# Patient Record
Sex: Female | Born: 1990 | Race: Black or African American | Hispanic: No | Marital: Single | Smoking: Current every day smoker
Health system: Southern US, Community
[De-identification: ages and names within clinical notes are randomized; demographics above are authoritative.]

---
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
## 2010-01-17 ENCOUNTER — Emergency Department (HOSPITAL_COMMUNITY): Admission: EM | Admit: 2010-01-17 | Discharge: 2010-01-18 | Payer: Self-pay | Admitting: Emergency Medicine

## 2010-10-03 IMAGING — CR DG CHEST 2V
2 series · 2 of 2 positions shown · non-contrast
Comparison: 09/05/2009

CLINICAL DATA: Cough.  Mid chest pain.

CHEST - 2 VIEW

[w chest pa]
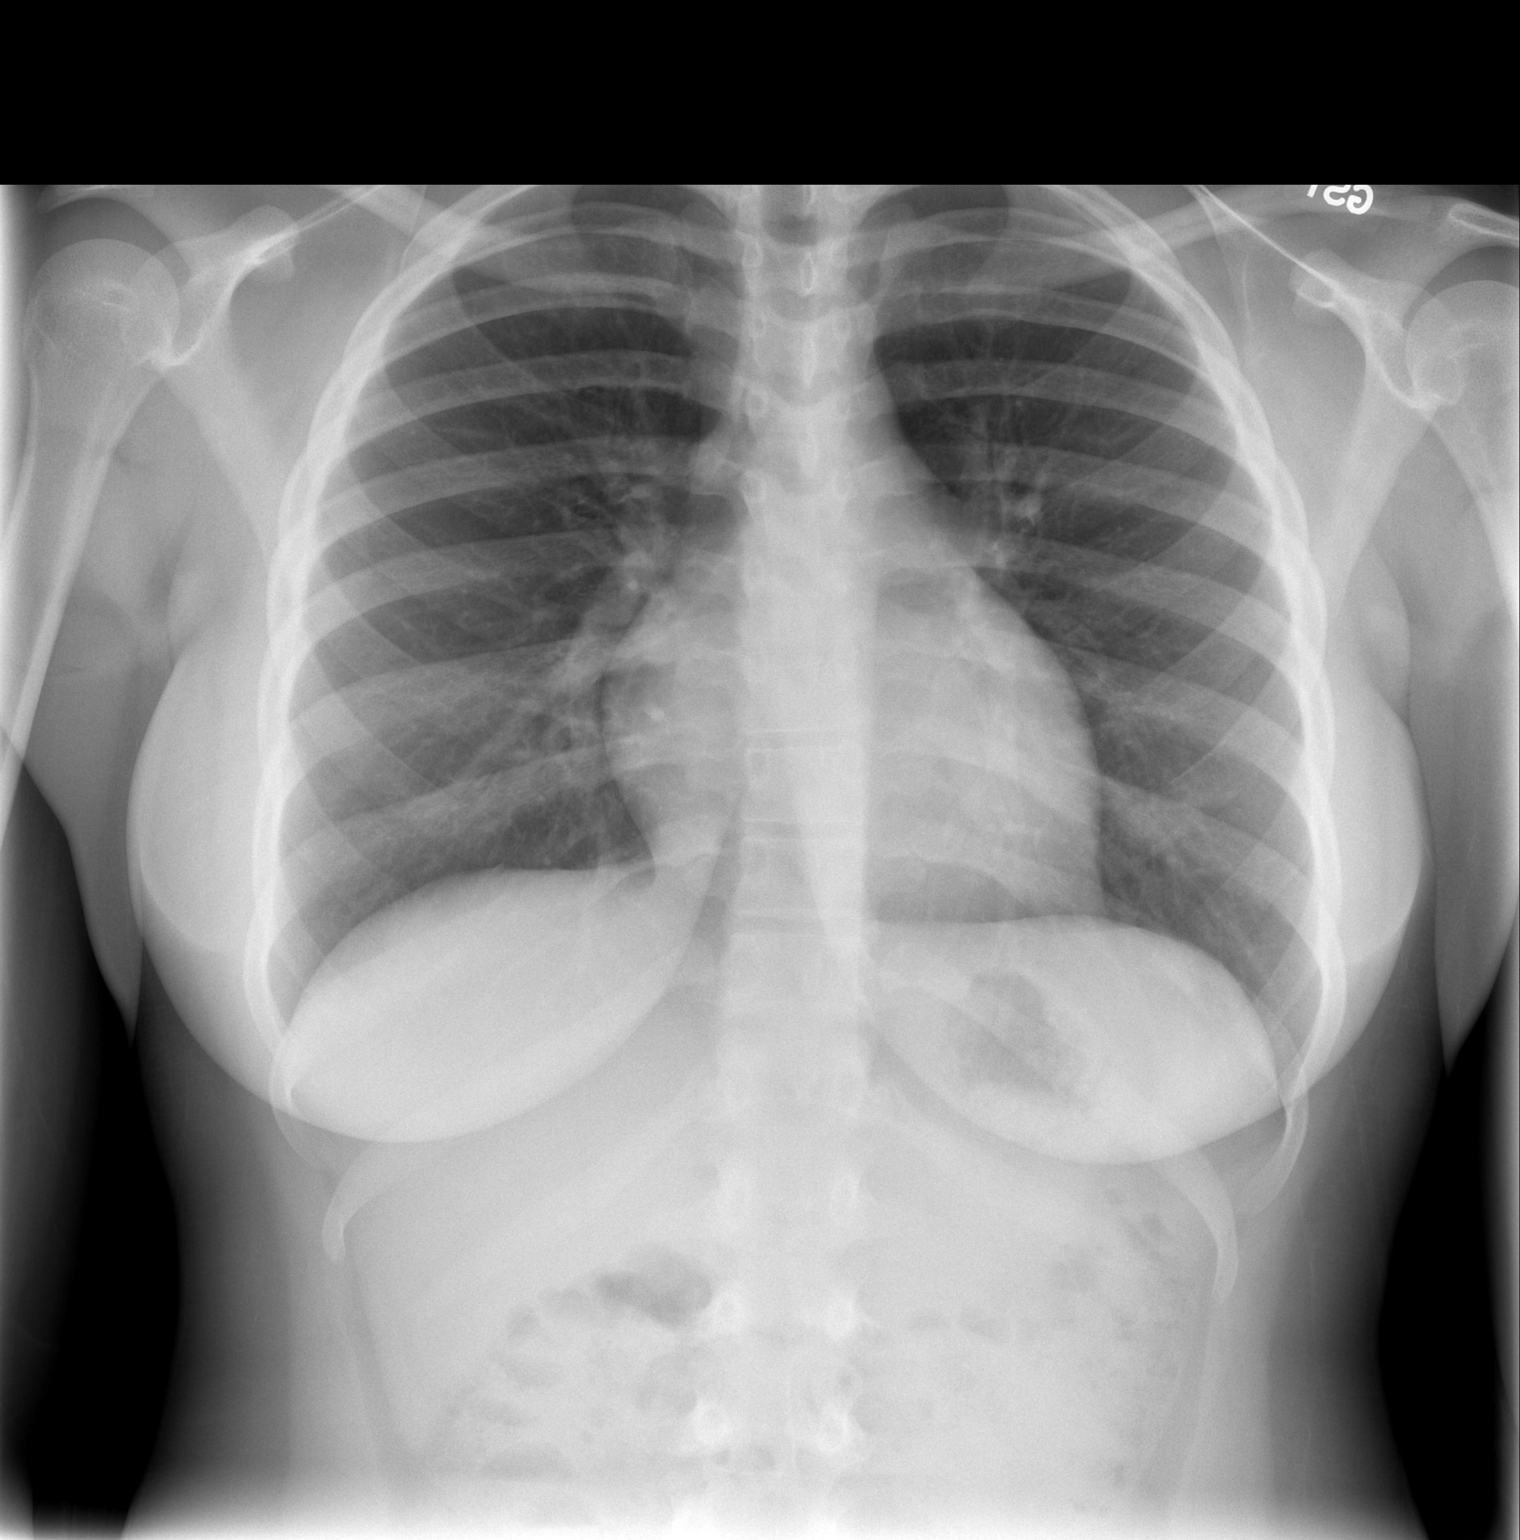

[w chest lat]
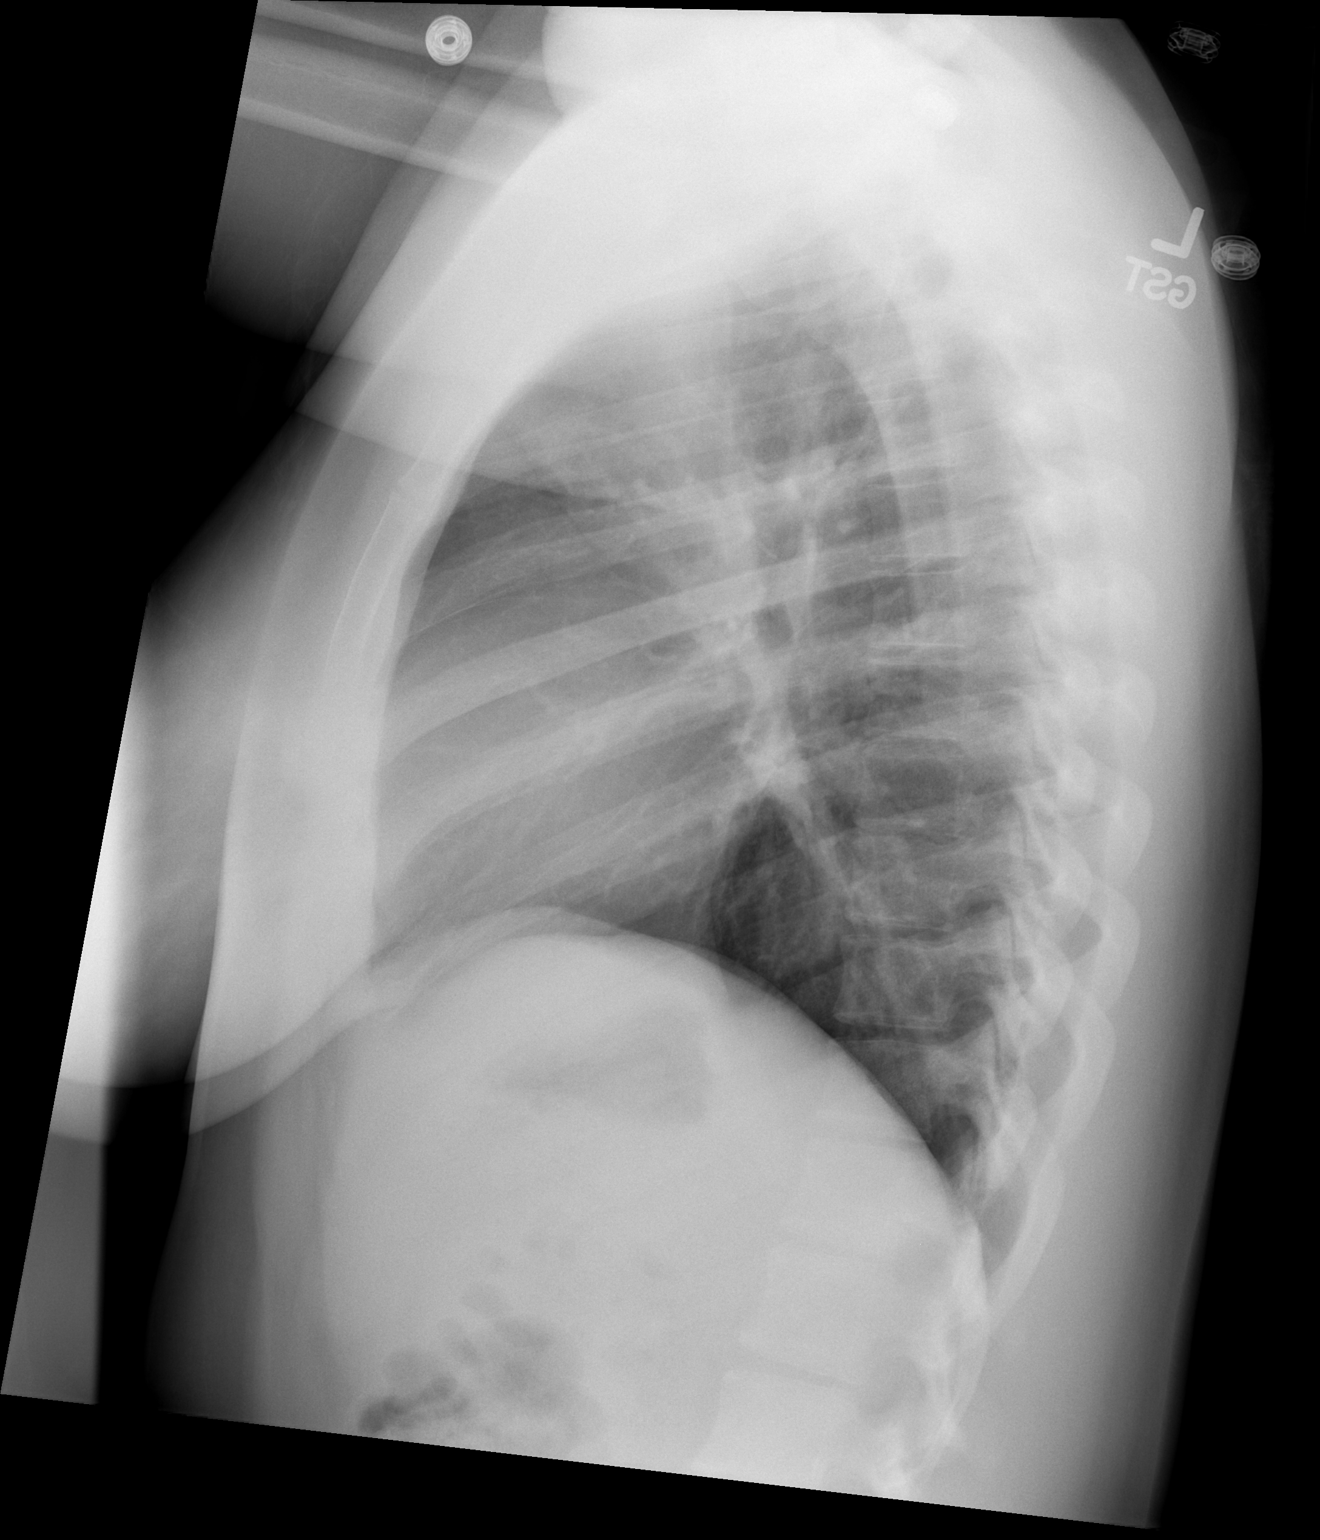

[2 of 2 positions shown; findings below may reference images not displayed]

FINDINGS: The heart size and mediastinal contours are within
normal limits.  Both lungs are clear.  The visualized skeletal
structures are unremarkable.
IMPRESSION: No active cardiopulmonary disease.

## 2010-11-06 ENCOUNTER — Emergency Department (HOSPITAL_COMMUNITY): Admission: EM | Admit: 2010-11-06 | Discharge: 2010-11-06 | Payer: Self-pay | Admitting: Emergency Medicine

## 2011-03-11 LAB — URINALYSIS, ROUTINE W REFLEX MICROSCOPIC
Glucose, UA: NEGATIVE mg/dL
Specific Gravity, Urine: 1.018 (ref 1.005–1.030)
pH: 7 (ref 5.0–8.0)

## 2011-03-11 LAB — URINE MICROSCOPIC-ADD ON

## 2011-03-11 LAB — POCT PREGNANCY, URINE: Preg Test, Ur: NEGATIVE

## 2011-07-28 ENCOUNTER — Emergency Department (HOSPITAL_COMMUNITY): Payer: Self-pay

## 2011-07-28 ENCOUNTER — Emergency Department (HOSPITAL_COMMUNITY)
Admission: EM | Admit: 2011-07-28 | Discharge: 2011-07-28 | Disposition: A | Discharge status: Home / Self Care | Payer: Self-pay | Attending: Emergency Medicine | Admitting: Emergency Medicine

## 2011-07-28 DIAGNOSIS — M25569 Pain in unspecified knee: Secondary | ICD-10-CM | POA: Insufficient documentation

## 2011-07-28 DIAGNOSIS — A5901 Trichomonal vulvovaginitis: Secondary | ICD-10-CM | POA: Insufficient documentation

## 2011-07-28 LAB — CBC
Hemoglobin: 11.9 g/dL — ABNORMAL LOW (ref 12.0–15.0)
MCHC: 31.7 g/dL (ref 30.0–36.0)
Platelets: 328 10*3/uL (ref 150–400)
RDW: 14.4 % (ref 11.5–15.5)
WBC: 5.2 10*3/uL (ref 4.0–10.5)

## 2011-07-28 LAB — BASIC METABOLIC PANEL
BUN: 9 mg/dL (ref 6–23)
Calcium: 10 mg/dL (ref 8.4–10.5)
Chloride: 101 mEq/L (ref 96–112)
Potassium: 4.2 mEq/L (ref 3.5–5.1)
Sodium: 136 mEq/L (ref 135–145)

## 2011-07-28 LAB — URINALYSIS, ROUTINE W REFLEX MICROSCOPIC
Glucose, UA: NEGATIVE mg/dL
pH: 6.5 (ref 5.0–8.0)

## 2011-07-28 LAB — DIFFERENTIAL
Basophils Absolute: 0 K/uL (ref 0.0–0.1)
Basophils Relative: 0 % (ref 0–1)
Eosinophils Absolute: 0 K/uL (ref 0.0–0.7)
Eosinophils Relative: 1 % (ref 0–5)
Lymphocytes Relative: 37 % (ref 12–46)
Lymphs Abs: 1.9 K/uL (ref 0.7–4.0)
Monocytes Absolute: 0.5 K/uL (ref 0.1–1.0)
Monocytes Relative: 9 % (ref 3–12)
Neutro Abs: 2.8 K/uL (ref 1.7–7.7)
Neutrophils Relative %: 54 % (ref 43–77)

## 2011-07-28 LAB — WET PREP, GENITAL: Yeast Wet Prep HPF POC: NONE SEEN

## 2011-07-28 LAB — URINE MICROSCOPIC-ADD ON

## 2011-07-28 LAB — PREGNANCY, URINE: Preg Test, Ur: NEGATIVE

## 2011-07-30 LAB — GC/CHLAMYDIA PROBE AMP, GENITAL: GC Probe Amp, Genital: NEGATIVE

## 2011-10-28 ENCOUNTER — Emergency Department (HOSPITAL_COMMUNITY)
Admission: EM | Admit: 2011-10-28 | Discharge: 2011-10-29 | Disposition: A | Discharge status: Home / Self Care | Payer: Self-pay | Attending: Emergency Medicine | Admitting: Emergency Medicine

## 2011-10-28 ENCOUNTER — Encounter: Payer: Self-pay | Admitting: *Deleted

## 2011-10-28 DIAGNOSIS — M549 Dorsalgia, unspecified: Secondary | ICD-10-CM | POA: Insufficient documentation

## 2011-10-28 MED ORDER — ACETAMINOPHEN 325 MG PO TABS
ORAL_TABLET | ORAL | Status: AC
  Filled 2011-10-28: qty 2

## 2011-10-28 MED ORDER — ACETAMINOPHEN 325 MG PO TABS: 650.0000 mg | ORAL_TABLET | Freq: Once | ORAL | Status: DC

## 2011-10-28 NOTE — ED Notes (Signed)
Pt in c/o back ache and chills x1 hour, pt states she started feeling weak this morning but thought it was from her period tonight developed fever

## 2012-08-18 ENCOUNTER — Emergency Department (HOSPITAL_COMMUNITY)
Admission: EM | Admit: 2012-08-18 | Discharge: 2012-08-18 | Disposition: A | Discharge status: Home / Self Care | Payer: Self-pay | Attending: Emergency Medicine | Admitting: Emergency Medicine

## 2012-08-18 ENCOUNTER — Encounter (HOSPITAL_COMMUNITY): Payer: Self-pay | Admitting: Emergency Medicine

## 2012-08-18 DIAGNOSIS — R112 Nausea with vomiting, unspecified: Secondary | ICD-10-CM | POA: Insufficient documentation

## 2012-08-18 DIAGNOSIS — Z888 Allergy status to other drugs, medicaments and biological substances status: Secondary | ICD-10-CM | POA: Insufficient documentation

## 2012-08-18 DIAGNOSIS — R110 Nausea: Secondary | ICD-10-CM

## 2012-08-18 LAB — URINALYSIS, ROUTINE W REFLEX MICROSCOPIC
Glucose, UA: NEGATIVE mg/dL
Hgb urine dipstick: NEGATIVE
Ketones, ur: 40 mg/dL — AB
Leukocytes, UA: NEGATIVE
Protein, ur: NEGATIVE mg/dL
pH: 6 (ref 5.0–8.0)

## 2012-08-18 LAB — CBC WITH DIFFERENTIAL/PLATELET
Eosinophils Absolute: 0 10*3/uL (ref 0.0–0.7)
Eosinophils Relative: 0 % (ref 0–5)
Lymphs Abs: 1.2 10*3/uL (ref 0.7–4.0)
MCH: 26.2 pg (ref 26.0–34.0)
MCV: 76.8 fL — ABNORMAL LOW (ref 78.0–100.0)
Monocytes Absolute: 0.5 10*3/uL (ref 0.1–1.0)
Platelets: 303 10*3/uL (ref 150–400)
RBC: 5 MIL/uL (ref 3.87–5.11)
RDW: 14 % (ref 11.5–15.5)

## 2012-08-18 LAB — BASIC METABOLIC PANEL
CO2: 23 mEq/L (ref 19–32)
Glucose, Bld: 84 mg/dL (ref 70–99)
Sodium: 138 mEq/L (ref 135–145)

## 2012-08-18 LAB — LIPASE, BLOOD: Lipase: 18 U/L (ref 11–59)

## 2012-08-18 LAB — HEPATIC FUNCTION PANEL
Alkaline Phosphatase: 51 U/L (ref 39–117)
Bilirubin, Direct: <0.1 mg/dL (ref 0.0–0.3)
Total Protein: 7.8 g/dL (ref 6.0–8.3)

## 2012-08-18 MED ORDER — ONDANSETRON 8 MG PO TBDP: 8.0000 mg | ORAL_TABLET | Freq: Three times a day (TID) | ORAL | Status: AC | PRN

## 2012-08-18 MED ORDER — ONDANSETRON 4 MG PO TBDP
8.0000 mg | ORAL_TABLET | Freq: Once | ORAL | Status: AC
  Administered 2012-08-18: 8 mg via ORAL
  Filled 2012-08-18: qty 2

## 2012-08-18 MED ORDER — PROMETHAZINE HCL 25 MG PO TABS: 25.0000 mg | ORAL_TABLET | Freq: Four times a day (QID) | ORAL | Status: DC | PRN

## 2012-08-18 NOTE — ED Notes (Signed)
Pt. Stated, I've had some stomach pain since July,  I know something is wrong, and I don't think I'm pregnant.

## 2012-08-18 NOTE — ED Notes (Signed)
MD at bedside.  Prescription changed to Phenergan

## 2012-08-18 NOTE — ED Provider Notes (Signed)
History     CSN: [id number]  Arrival date & time 08/18/12  1346   First MD Initiated Contact with Patient 08/18/12 1808      Chief Complaint  Patient presents with  . Nausea    (Consider location/radiation/quality/duration/timing/severity/associated sxs/prior treatment) HPI Comments: Patient presents today with a chief complaint of intermittent nausea and vomiting over the past month.  She does report that it is typically associated with food, but no foods in particular.  She is feeling nauseous at this time, but no vomiting in the past 2 days.  She reports that she has also had intermittent epigastric abdominal pain.  No blood in her emesis.  No bilious emesis.  No fever or chills.  No diarrhea or constipation.  She does not have a PCP in town.  She is currently in town for college.  She has been under increased stress recently.  No prior history of Gallbladder disease.    Patient is a 21 y.o. female presenting with vomiting. The history is provided by the patient.  Emesis  The emesis has an appearance of stomach contents. There has been no fever. Pertinent negatives include no abdominal pain, no chills, no diarrhea and no fever.    History reviewed. No pertinent past medical history.  History reviewed. No pertinent past surgical history.  History reviewed. No pertinent family history.  History  Substance Use Topics  . Smoking status: Never Smoker   . Smokeless tobacco: Not on file  . Alcohol Use: No    OB History    Grav Para Term Preterm Abortions TAB SAB Ect Mult Living                  Review of Systems  Constitutional: Negative for fever and chills.  Gastrointestinal: Positive for nausea and vomiting. Negative for abdominal pain, diarrhea, constipation and blood in stool.  Genitourinary: Negative for dysuria, urgency, frequency, hematuria, decreased urine volume, vaginal bleeding, vaginal discharge, vaginal pain, menstrual problem, pelvic pain and dyspareunia.    Neurological: Negative for dizziness, syncope and light-headedness.    Allergies  Benadryl  Home Medications   Current Outpatient Rx  Name Route Sig Dispense Refill  . FERROUS GLUCONATE 240 (27 FE) MG PO TABS Oral Take 240 mg by mouth daily as needed. For weakness.      BP 131/89  Pulse 78  Temp 98.8 F (37.1 C) (Oral)  Resp 16  SpO2 100%  LMP 07/20/2012  Physical Exam  Nursing note and vitals reviewed. Constitutional: She appears well-developed and well-nourished. No distress.  HENT:  Head: Normocephalic and atraumatic.  Mouth/Throat: Oropharynx is clear and moist.  Neck: Normal range of motion.  Cardiovascular: Normal rate, regular rhythm and normal heart sounds.   Pulmonary/Chest: Effort normal and breath sounds normal.  Abdominal: Soft. Bowel sounds are normal. She exhibits no distension and no mass. There is tenderness in the epigastric area. There is no rigidity, no rebound and no guarding.  Neurological: She is alert.  Skin: Skin is warm and dry. She is not diaphoretic.  Psychiatric: She has a normal mood and affect.    ED Course  Procedures (including critical care time)  Labs Reviewed  CBC WITH DIFFERENTIAL - Abnormal; Notable for the following:    MCV 76.8 (*)     All other components within normal limits  URINALYSIS, ROUTINE W REFLEX MICROSCOPIC - Abnormal; Notable for the following:    Bilirubin Urine SMALL (*)     Ketones, ur 40 (*)  All other components within normal limits  BASIC METABOLIC PANEL  POCT PREGNANCY, URINE  LIPASE, BLOOD  HEPATIC FUNCTION PANEL   No results found.   No diagnosis found.  8:18 PM Patient able to tolerate po liquids.  Will discharge patient home.  MDM  Patient presenting with a chief complaint of intermittent nausea and vomiting intermittently over the past month.  Labs today unremarkable.  UA and urine pregnancy negative. VSS.  Afebrile.  Mild epigastric tenderness on abdominal exam, but no rebound or  guarding.  Patient able to tolerate PO liquids.  Patient discharged home with Rx for Phenergan.  Patient also given referral to GI if symptoms continue and given resource guide.  Return precautions discussed.  Patient in agreement with the plan.        Pascal Lux [place], PA-C 08/19/12 [phone]

## 2012-08-18 NOTE — ED Notes (Signed)
Pt c/o nausea x 1 month intermittently; pt sts LMP was end of July

## 2012-08-18 NOTE — ED Notes (Signed)
Discharged home with written and verbal instructions.  No questions or concerns.

## 2012-08-18 NOTE — ED Notes (Signed)
Patient states "rash noted to hand and on chest, complains of itching to same".  Concerned she might be allergic to Zofran.

## 2012-08-18 NOTE — ED Notes (Signed)
MD at bedside. 

## 2012-08-19 NOTE — ED Provider Notes (Signed)
Medical screening examination/treatment/procedure(s) were performed by non-physician practitioner and as supervising physician I was immediately available for consultation/collaboration.   Carleene Cooper III, MD 08/19/12 1213

## 2014-03-03 ENCOUNTER — Emergency Department (HOSPITAL_COMMUNITY)
Admission: EM | Admit: 2014-03-03 | Discharge: 2014-03-03 | Disposition: A | Discharge status: Home / Self Care | Payer: Self-pay | Attending: Emergency Medicine | Admitting: Emergency Medicine

## 2014-03-03 ENCOUNTER — Encounter (HOSPITAL_COMMUNITY): Payer: Self-pay | Admitting: Emergency Medicine

## 2014-03-03 DIAGNOSIS — N949 Unspecified condition associated with female genital organs and menstrual cycle: Secondary | ICD-10-CM | POA: Insufficient documentation

## 2014-03-03 DIAGNOSIS — N938 Other specified abnormal uterine and vaginal bleeding: Secondary | ICD-10-CM

## 2014-03-03 DIAGNOSIS — A499 Bacterial infection, unspecified: Secondary | ICD-10-CM | POA: Insufficient documentation

## 2014-03-03 DIAGNOSIS — N760 Acute vaginitis: Secondary | ICD-10-CM | POA: Insufficient documentation

## 2014-03-03 DIAGNOSIS — Z3202 Encounter for pregnancy test, result negative: Secondary | ICD-10-CM | POA: Insufficient documentation

## 2014-03-03 DIAGNOSIS — N946 Dysmenorrhea, unspecified: Secondary | ICD-10-CM

## 2014-03-03 DIAGNOSIS — B9689 Other specified bacterial agents as the cause of diseases classified elsewhere: Secondary | ICD-10-CM

## 2014-03-03 DIAGNOSIS — F17200 Nicotine dependence, unspecified, uncomplicated: Secondary | ICD-10-CM | POA: Insufficient documentation

## 2014-03-03 LAB — BASIC METABOLIC PANEL
BUN: 10 mg/dL (ref 6–23)
CALCIUM: 9.4 mg/dL (ref 8.4–10.5)
CHLORIDE: 100 meq/L (ref 96–112)
CO2: 23 meq/L (ref 19–32)
Creatinine, Ser: 0.69 mg/dL (ref 0.50–1.10)
GFR calc Af Amer: >90 mL/min (ref >90)
GFR calc non Af Amer: >90 mL/min (ref >90)
GLUCOSE: 73 mg/dL (ref 70–99)
POTASSIUM: 4 meq/L (ref 3.7–5.3)
SODIUM: 137 meq/L (ref 137–147)

## 2014-03-03 LAB — CBC WITH DIFFERENTIAL/PLATELET
BASOS ABS: 0 10*3/uL (ref 0.0–0.1)
Basophils Relative: 0 % (ref 0–1)
EOS PCT: 1 % (ref 0–5)
Eosinophils Absolute: 0 10*3/uL (ref 0.0–0.7)
HCT: 35.2 % — ABNORMAL LOW (ref 36.0–46.0)
Hemoglobin: 11.7 g/dL — ABNORMAL LOW (ref 12.0–15.0)
LYMPHS ABS: 0.7 10*3/uL (ref 0.7–4.0)
LYMPHS PCT: 15 % (ref 12–46)
MCH: 25.6 pg — ABNORMAL LOW (ref 26.0–34.0)
MCHC: 33.2 g/dL (ref 30.0–36.0)
MCV: 77 fL — AB (ref 78.0–100.0)
Monocytes Absolute: 0.7 10*3/uL (ref 0.1–1.0)
Monocytes Relative: 15 % — ABNORMAL HIGH (ref 3–12)
NEUTROS ABS: 3 10*3/uL (ref 1.7–7.7)
NEUTROS PCT: 69 % (ref 43–77)
PLATELETS: 307 10*3/uL (ref 150–400)
RBC: 4.57 MIL/uL (ref 3.87–5.11)
RDW: 13.5 % (ref 11.5–15.5)
WBC: 4.4 10*3/uL (ref 4.0–10.5)

## 2014-03-03 LAB — URINALYSIS, ROUTINE W REFLEX MICROSCOPIC
BILIRUBIN URINE: NEGATIVE
Glucose, UA: NEGATIVE mg/dL
Ketones, ur: 40 mg/dL — AB
Leukocytes, UA: NEGATIVE
NITRITE: NEGATIVE
PH: 6 (ref 5.0–8.0)
Protein, ur: NEGATIVE mg/dL
SPECIFIC GRAVITY, URINE: 1.028 (ref 1.005–1.030)
UROBILINOGEN UA: 0.2 mg/dL (ref 0.0–1.0)

## 2014-03-03 LAB — URINE MICROSCOPIC-ADD ON

## 2014-03-03 LAB — PREGNANCY, URINE: PREG TEST UR: NEGATIVE

## 2014-03-03 LAB — WET PREP, GENITAL
TRICH WET PREP: NONE SEEN
YEAST WET PREP: NONE SEEN

## 2014-03-03 MED ORDER — KETOROLAC TROMETHAMINE 60 MG/2ML IM SOLN
60.0000 mg | Freq: Once | INTRAMUSCULAR | Status: AC
  Administered 2014-03-03: 60 mg via INTRAMUSCULAR
  Filled 2014-03-03: qty 2

## 2014-03-03 MED ORDER — METRONIDAZOLE 500 MG PO TABS: 500.0000 mg | ORAL_TABLET | Freq: Two times a day (BID) | ORAL | Status: DC

## 2014-03-03 MED ORDER — ONDANSETRON HCL 4 MG PO TABS: 4.0000 mg | ORAL_TABLET | Freq: Three times a day (TID) | ORAL | Status: DC | PRN

## 2014-03-03 MED ORDER — ONDANSETRON 4 MG PO TBDP
4.0000 mg | ORAL_TABLET | Freq: Once | ORAL | Status: AC
  Administered 2014-03-03: 4 mg via ORAL
  Filled 2014-03-03: qty 1

## 2014-03-03 NOTE — Discharge Instructions (Signed)
Take aleve 2 tabs OTC twice a day for your menstrual pain. Take the antibiotics until gone. You have a minor pelvic infection, take the antibiotics until gone. Call [facility] to get an appointment, you shouldn't need insurance to be seen. If your symptoms get worse, go the the maternity admission unit at [facility] to be evaluated. They are a 24/7 emergency evaluation for female problems.  Abnormal Uterine Bleeding Abnormal uterine bleeding can affect women at various stages in life, including teenagers, women in their reproductive years, pregnant women, and women who have reached menopause. Several kinds of uterine bleeding are considered abnormal, including:  Bleeding or spotting between periods.   Bleeding after sexual intercourse.   Bleeding that is heavier or more than normal.   Periods that last longer than usual.  Bleeding after menopause.  Many cases of abnormal uterine bleeding are minor and simple to treat, while others are more serious. Any type of abnormal bleeding should be evaluated by your health care provider. Treatment will depend on the cause of the bleeding. HOME CARE INSTRUCTIONS Monitor your condition for any changes. The following actions may help to alleviate any discomfort you are experiencing:  Avoid the use of tampons and douches as directed by your health care provider.  Change your pads frequently. You should get regular pelvic exams and Pap tests. Keep all follow-up appointments for diagnostic tests as directed by your health care provider.  SEEK MEDICAL CARE IF:   Your bleeding lasts more than 1 week.   You feel dizzy at times.  SEEK IMMEDIATE MEDICAL CARE IF:   You pass out.   You are changing pads every 15 to 30 minutes.   You have abdominal pain.  You have a fever.   You become sweaty or weak.   You are passing large blood clots from the vagina.   You start to feel nauseous and vomit. MAKE SURE YOU:   Understand  these instructions.  Will watch your condition.  Will get help right away if you are not doing well or get worse. Document Released: 12/09/2005 Document Revised: 08/11/2013 Document Reviewed: 07/08/2013 [facility] Patient Information 2014 [place], [place].  Bacterial Vaginosis Bacterial vaginosis is a vaginal infection that occurs when the normal balance of bacteria in the vagina is disrupted. It results from an overgrowth of certain bacteria. This is the most common vaginal infection in women of childbearing age. Treatment is important to prevent complications, especially in pregnant women, as it can cause a premature delivery. CAUSES  Bacterial vaginosis is caused by an increase in harmful bacteria that are normally present in smaller amounts in the vagina. Several different kinds of bacteria can cause bacterial vaginosis. However, the reason that the condition develops is not fully understood. RISK FACTORS Certain activities or behaviors can put you at an increased risk of developing bacterial vaginosis, including:  Having a new sex partner or multiple sex partners.  Douching.  Using an intrauterine device (IUD) for contraception. Women do not get bacterial vaginosis from toilet seats, bedding, swimming pools, or contact with objects around them. SIGNS AND SYMPTOMS  Some women with bacterial vaginosis have no signs or symptoms. Common symptoms include:  Grey vaginal discharge.  A fishlike odor with discharge, especially after sexual intercourse.  Itching or burning of the vagina and vulva.  Burning or pain with urination. DIAGNOSIS  Your health care provider will take a medical history and examine the vagina for signs of bacterial vaginosis. A sample of vaginal fluid may  be taken. Your health care provider will look at this sample under a microscope to check for bacteria and abnormal cells. A vaginal pH test may also be done.  TREATMENT  Bacterial vaginosis may be treated with  antibiotic medicines. These may be given in the form of a pill or a vaginal cream. A second round of antibiotics may be prescribed if the condition comes back after treatment.  HOME CARE INSTRUCTIONS   Only take over-the-counter or prescription medicines as directed by your health care provider.  If antibiotic medicine was prescribed, take it as directed. Make sure you finish it even if you start to feel better.  Do not have sex until treatment is completed.  Tell all sexual partners that you have a vaginal infection. They should see their health care provider and be treated if they have problems, such as a mild rash or itching.  Practice safe sex by using condoms and only having one sex partner. SEEK MEDICAL CARE IF:   Your symptoms are not improving after 3 days of treatment.  You have increased discharge or pain.  You have a fever. MAKE SURE YOU:   Understand these instructions.  Will watch your condition.  Will get help right away if you are not doing well or get worse. FOR MORE INFORMATION  Centers for Disease Control and Prevention, Division of STD Prevention: SolutionApps.co.zawww.cdc.gov/std American Sexual Health Association (ASHA): www.ashastd.org  Document Released: 12/09/2005 Document Revised: 09/29/2013 Document Reviewed: 07/21/2013 [facility] Patient Information 2014 [place].  Dysmenorrhea Menstrual cramps (dysmenorrhea) are caused by the muscles of the uterus tightening (contracting) during a menstrual period. For some women, this discomfort is merely bothersome. For others, dysmenorrhea can be severe enough to interfere with everyday activities for a few days each month. Primary dysmenorrhea is menstrual cramps that last a couple of days when you start having menstrual periods or soon after. This often begins after a teenager starts having her period. As a woman gets older or has a baby, the cramps will usually lessen or disappear. Secondary dysmenorrhea begins later in life,  lasts longer, and the pain may be stronger than primary dysmenorrhea. The pain may start before the period and last a few days after the period.  CAUSES  Dysmenorrhea is usually caused by an underlying problem, such as:  The tissue lining the uterus grows outside of the uterus in other areas of the body (endometriosis).  The endometrial tissue, which normally lines the uterus, is found in or grows into the muscular walls of the uterus (adenomyosis).  The pelvic blood vessels are engorged with blood just before the menstrual period (pelvic congestive syndrome).  Overgrowth of cells (polyps) in the lining of the uterus or cervix.  Falling down of the uterus (prolapse) because of loose or stretched ligaments.  Depression.  Bladder problems, infection, or inflammation.  Problems with the intestine, a tumor, or irritable bowel syndrome.  Cancer of the female organs or bladder.  A severely tipped uterus.  A very tight opening or closed cervix.  Noncancerous tumors of the uterus (fibroids).  Pelvic inflammatory disease (PID).  Pelvic scarring (adhesions) from a previous surgery.  Ovarian cyst.  An intrauterine device (IUD) used for birth control. RISK FACTORS You may be at greater risk of dysmenorrhea if:  You are younger than age 23.  You started puberty early.  You have irregular or heavy bleeding.  You have never given birth.  You have a family history of this problem.  You are a smoker.  SIGNS AND SYMPTOMS   Cramping or throbbing pain in your lower abdomen.  Headaches.  Lower back pain.  Nausea or vomiting.  Diarrhea.  Sweating or dizziness.  Loose stools. DIAGNOSIS  A diagnosis is based on your history, symptoms, physical exam, diagnostic tests, or procedures. Diagnostic tests or procedures may include:  Blood tests.  Ultrasonography.  An examination of the lining of the uterus (dilation and curettage, D&C).  An examination inside your abdomen or  pelvis with a scope (laparoscopy).  X-rays.  CT scan.  MRI.  An examination inside the bladder with a scope (cystoscopy).  An examination inside the intestine or stomach with a scope (colonoscopy, gastroscopy). TREATMENT  Treatment depends on the cause of the dysmenorrhea. Treatment may include:  Pain medicine prescribed by your health care provider.  Birth control pills or an IUD with progesterone hormone in it.  Hormone replacement therapy.  Nonsteroidal anti-inflammatory drugs (NSAIDs). These may help stop the production of prostaglandins.  Surgery to remove adhesions, endometriosis, ovarian cyst, or fibroids.  Removal of the uterus (hysterectomy).  Progesterone shots to stop the menstrual period.  Cutting the nerves on the sacrum that go to the female organs (presacral neurectomy).  Electric current to the sacral nerves (sacral nerve stimulation).  Antidepressant medicine.  Psychiatric therapy, counseling, or group therapy.  Exercise and physical therapy.  Meditation and yoga therapy.  Acupuncture. HOME CARE INSTRUCTIONS   Only take over-the-counter or prescription medicines as directed by your health care provider.  Place a heating pad or hot water bottle on your lower back or abdomen. Do not sleep with the heating pad.  Use aerobic exercises, walking, swimming, biking, and other exercises to help lessen the cramping.  Massage to the lower back or abdomen may help.  Stop smoking.  Avoid alcohol and caffeine. SEEK MEDICAL CARE IF:   Your pain does not get better with medicine.  You have pain with sexual intercourse.  Your pain increases and is not controlled with medicines.  You have abnormal vaginal bleeding with your period.  You develop nausea or vomiting with your period that is not controlled with medicine. SEEK IMMEDIATE MEDICAL CARE IF:  You pass out.  Document Released: 12/09/2005 Document Revised: 08/11/2013 Document Reviewed:  05/27/2013 [facility] Patient Information 2014 [place], [place].

## 2014-03-03 NOTE — ED Notes (Signed)
Pt reports that she has had generalized abdominal pain for the past year that goes along with her menstrual cycle, but her cycle is uneven. Pt states that she currently has vaginal bleeding. Pt a&o x4, NAD noted at this time.

## 2014-03-03 NOTE — ED Provider Notes (Signed)
CSN: [id number]     Arrival date & time 03/03/14  2006 History   First MD Initiated Contact with Patient 03/03/14 2101     Chief Complaint  Patient presents with  . Abdominal Pain     (Consider location/radiation/quality/duration/timing/severity/associated sxs/prior Treatment) HPI Patient is G0 P0 Ab0, she reports for the past year she has been having pain when she has her menses. She also reports she's been having approximately 2 periods a month. She states they were approximately every 21 days apart and then they got to be 14 days apart. She states her last period was a week ago and  she started bleeding again yesterday. She describes her abdominal pain as being periumbilical and aching. She states if she rubs her abdomen it feels better. She states nothing makes it feel worse such as movement or coughing. She states she did have nausea and vomiting once this morning. She states she normally has the pain the first 3 days of her period. She stated she takes Advil 3 tablets once a day or Midol 2 tablets once a day for her discomfort. She denies feeling dizzy or weak. She states she had an appointment today with her gynecologist however her insurance had not come through yet. She states they would not see her without insurance, this was at the [facility] at [facility]. Patient is sexually active however due to her discomfort she's not had sex since November. She reports using protection.   PCP none  History reviewed. No pertinent past medical history. History reviewed. No pertinent past surgical history. History reviewed. No pertinent family history. History  Substance Use Topics  . Smoking status: Current Every Day Smoker    Types: Cigarettes  . Smokeless tobacco: Never Used  . Alcohol Use: Yes     Comment: sometimes  college student    OB History   Grav Para Term Preterm Abortions TAB SAB Ect Mult Living                 Review of Systems  All other systems reviewed and  are negative.      Allergies  Benadryl  Home Medications   Current Outpatient Rx  Name  Route  Sig  Dispense  Refill  . EXPIRED: promethazine (PHENERGAN) 25 MG tablet   Oral   Take 1 tablet (25 mg total) by mouth every 6 (six) hours as needed for nausea.   20 tablet   0    BP 127/83  Pulse 98  Temp(Src) 99.3 F (37.4 C) (Oral)  Resp 16  SpO2 100%  LMP 03/01/2014  Vital signs normal   Physical Exam  Nursing note and vitals reviewed. Constitutional: She is oriented to person, place, and time. She appears well-developed and well-nourished.  Non-toxic appearance. She does not appear ill. No distress.  HENT:  Head: Normocephalic and atraumatic.  Right Ear: External ear normal.  Left Ear: External ear normal.  Nose: Nose normal. No mucosal edema or rhinorrhea.  Mouth/Throat: Oropharynx is clear and moist and mucous membranes are normal. No dental abscesses or uvula swelling.  Eyes: Conjunctivae and EOM are normal. Pupils are equal, round, and reactive to light.  Neck: Normal range of motion and full passive range of motion without pain. Neck supple.  Cardiovascular: Normal rate, regular rhythm and normal heart sounds.  Exam reveals no gallop and no friction rub.   No murmur heard. Pulmonary/Chest: Effort normal and breath sounds normal. No respiratory distress. She has no wheezes. She has  no rhonchi. She has no rales. She exhibits no tenderness and no crepitus.  Abdominal: Soft. Normal appearance and bowel sounds are normal. She exhibits no distension. There is no tenderness. There is no rebound and no guarding.  Genitourinary:  Normal external genitalia. She does have some dark red blood in the vault. Her uterus is normal size and nontender, her adnexa are not enlarged and are nontender.  Musculoskeletal: Normal range of motion. She exhibits no edema and no tenderness.  Moves all extremities well.   Neurological: She is alert and oriented to person, place, and time. She  has normal strength. No cranial nerve deficit.  Skin: Skin is warm, dry and intact. No rash noted. No erythema. No pallor.  Psychiatric: She has a normal mood and affect. Her speech is normal and behavior is normal. Her mood appears not anxious.    ED Course  Procedures (including critical care time)  New Prescriptions   METRONIDAZOLE (FLAGYL) 500 MG TABLET    Take 1 tablet (500 mg total) by mouth 2 (two) times daily.   ONDANSETRON (ZOFRAN) 4 MG TABLET    Take 1 tablet (4 mg total) by mouth every 8 (eight) hours as needed for nausea or vomiting.   Patient advised to followup with GYN. Her bacterial vaginosis will be treated for antibiotics which may help some of her symptoms. She was advised she will be called if her STD test are positive. Despite the heavy frequent periods patient is not significantly anemic.  Labs Review Results for orders placed during the hospital encounter of 03/03/14  WET PREP, GENITAL      Result Value Ref Range   Yeast Wet Prep HPF POC NONE SEEN  NONE SEEN   Trich, Wet Prep NONE SEEN  NONE SEEN   Clue Cells Wet Prep HPF POC RARE (*) NONE SEEN   WBC, Wet Prep HPF POC RARE (*) NONE SEEN  URINALYSIS, ROUTINE W REFLEX MICROSCOPIC      Result Value Ref Range   Color, Urine YELLOW  YELLOW   APPearance CLOUDY (*) CLEAR   Specific Gravity, Urine 1.028  1.005 - 1.030   pH 6.0  5.0 - 8.0   Glucose, UA NEGATIVE  NEGATIVE mg/dL   Hgb urine dipstick LARGE (*) NEGATIVE   Bilirubin Urine NEGATIVE  NEGATIVE   Ketones, ur 40 (*) NEGATIVE mg/dL   Protein, ur NEGATIVE  NEGATIVE mg/dL   Urobilinogen, UA 0.2  0.0 - 1.0 mg/dL   Nitrite NEGATIVE  NEGATIVE   Leukocytes, UA NEGATIVE  NEGATIVE  PREGNANCY, URINE      Result Value Ref Range   Preg Test, Ur NEGATIVE  NEGATIVE  CBC WITH DIFFERENTIAL      Result Value Ref Range   WBC 4.4  4.0 - 10.5 K/uL   RBC 4.57  3.87 - 5.11 MIL/uL   Hemoglobin 11.7 (*) 12.0 - 15.0 g/dL   HCT 40.935.2 (*) 81.136.0 - 91.446.0 %   MCV 77.0 (*) 78.0 -  100.0 fL   MCH 25.6 (*) 26.0 - 34.0 pg   MCHC 33.2  30.0 - 36.0 g/dL   RDW 78.213.5  95.611.5 - 21.315.5 %   Platelets 307  150 - 400 K/uL   Neutrophils Relative % 69  43 - 77 %   Neutro Abs 3.0  1.7 - 7.7 K/uL   Lymphocytes Relative 15  12 - 46 %   Lymphs Abs 0.7  0.7 - 4.0 K/uL   Monocytes Relative 15 (*) 3 - 12 %  Monocytes Absolute 0.7  0.1 - 1.0 K/uL   Eosinophils Relative 1  0 - 5 %   Eosinophils Absolute 0.0  0.0 - 0.7 K/uL   Basophils Relative 0  0 - 1 %   Basophils Absolute 0.0  0.0 - 0.1 K/uL  BASIC METABOLIC PANEL      Result Value Ref Range   Sodium 137  137 - 147 mEq/L   Potassium 4.0  3.7 - 5.3 mEq/L   Chloride 100  96 - 112 mEq/L   CO2 23  19 - 32 mEq/L   Glucose, Bld 73  70 - 99 mg/dL   BUN 10  6 - 23 mg/dL   Creatinine, Ser 4.09  0.50 - 1.10 mg/dL   Calcium 9.4  8.4 - 81.1 mg/dL   GFR calc non Af Amer >90  >90 mL/min   GFR calc Af Amer >90  >90 mL/min  URINE MICROSCOPIC-ADD ON      Result Value Ref Range   Squamous Epithelial / LPF RARE  RARE   WBC, UA 3-6  <3 WBC/hpf   RBC / HPF 21-50  <3 RBC/hpf   Bacteria, UA FEW (*) RARE   Laboratory interpretation all normal except bv, (pt on menses which accounts for the blood in her urine)    Imaging Review No results found.   EKG Interpretation None      MDM   Final diagnoses:  DUB (dysfunctional uterine bleeding)  BV (bacterial vaginosis)  Menstrual pain    New Prescriptions   METRONIDAZOLE (FLAGYL) 500 MG TABLET    Take 1 tablet (500 mg total) by mouth 2 (two) times daily.   ONDANSETRON (ZOFRAN) 4 MG TABLET    Take 1 tablet (4 mg total) by mouth every 8 (eight) hours as needed for nausea or vomiting.   Aleve OTC  Plan discharge   Devoria Albe, MD, Franz Dell, MD 03/03/14 [phone]

## 2014-03-04 LAB — GC/CHLAMYDIA PROBE AMP
CT PROBE, AMP APTIMA: NEGATIVE
GC PROBE AMP APTIMA: NEGATIVE

## 2014-03-04 LAB — RPR: RPR: NONREACTIVE

## 2014-03-04 LAB — HIV ANTIBODY (ROUTINE TESTING W REFLEX): HIV: NONREACTIVE

## 2014-10-05 ENCOUNTER — Emergency Department (HOSPITAL_COMMUNITY)
Admission: EM | Admit: 2014-10-05 | Discharge: 2014-10-05 | Disposition: A | Discharge status: Home / Self Care | Payer: Self-pay | Attending: Emergency Medicine | Admitting: Emergency Medicine

## 2014-10-05 ENCOUNTER — Encounter (HOSPITAL_COMMUNITY): Payer: Self-pay | Admitting: Emergency Medicine

## 2014-10-05 DIAGNOSIS — Z3202 Encounter for pregnancy test, result negative: Secondary | ICD-10-CM | POA: Insufficient documentation

## 2014-10-05 DIAGNOSIS — Z720 Tobacco use: Secondary | ICD-10-CM | POA: Insufficient documentation

## 2014-10-05 DIAGNOSIS — N926 Irregular menstruation, unspecified: Secondary | ICD-10-CM

## 2014-10-05 DIAGNOSIS — M546 Pain in thoracic spine: Secondary | ICD-10-CM | POA: Insufficient documentation

## 2014-10-05 DIAGNOSIS — N939 Abnormal uterine and vaginal bleeding, unspecified: Secondary | ICD-10-CM | POA: Insufficient documentation

## 2014-10-05 LAB — URINE MICROSCOPIC-ADD ON

## 2014-10-05 LAB — URINALYSIS, ROUTINE W REFLEX MICROSCOPIC
Bilirubin Urine: NEGATIVE
GLUCOSE, UA: NEGATIVE mg/dL
KETONES UR: 15 mg/dL — AB
NITRITE: NEGATIVE
PH: 6 (ref 5.0–8.0)
Protein, ur: 30 mg/dL — AB
Specific Gravity, Urine: 1.031 — ABNORMAL HIGH (ref 1.005–1.030)
Urobilinogen, UA: 1 mg/dL (ref 0.0–1.0)

## 2014-10-05 LAB — WET PREP, GENITAL
Clue Cells Wet Prep HPF POC: NONE SEEN
TRICH WET PREP: NONE SEEN
YEAST WET PREP: NONE SEEN

## 2014-10-05 LAB — POC URINE PREG, ED: PREG TEST UR: NEGATIVE

## 2014-10-05 MED ORDER — IBUPROFEN 600 MG PO TABS: 600.0000 mg | ORAL_TABLET | Freq: Four times a day (QID) | ORAL | Status: DC | PRN

## 2014-10-05 MED ORDER — CYCLOBENZAPRINE HCL 10 MG PO TABS: 10.0000 mg | ORAL_TABLET | Freq: Two times a day (BID) | ORAL | Status: DC | PRN

## 2014-10-05 NOTE — ED Notes (Signed)
Per pt sts back pain and abnormal vaginal bleeding since being off birth control.

## 2014-10-05 NOTE — ED Provider Notes (Signed)
Medical screening examination/treatment/procedure(s) were performed by non-physician practitioner and as supervising physician I was immediately available for consultation/collaboration.    Vida RollerBrian D Marajade Lei, MD 10/05/14 2126

## 2014-10-05 NOTE — ED Provider Notes (Signed)
CSN: [id number]     Arrival date & time 10/05/14  1202 History   First MD Initiated Contact with Patient 10/05/14 1244     Chief Complaint  Patient presents with  . Back Pain  . Vaginal Bleeding     (Consider location/radiation/quality/duration/timing/severity/associated sxs/prior Treatment) HPI Comments:  Patient presents emergency department with chief complaint of abnormal vaginal bleeding and back pain.   Back pain: She states the back pain started about 2 weeks ago. She thinks that she strained a muscle in her upper back. She does not recall the specific mechanism of injury, but thinks that she slipped on a fine. She states that the pain is worsened after standing for long periods of time. She states that the muscle feels tight. She denies any fevers chills. Denies any weakness or numbness of the extremities. She has not taken anything to alleviate her symptoms.   Vaginal bleeding: She states that she is been having irregular periods since coming off her birth control. She wants to be certain that she does not have any infection and is not pregnant. She denies any abdominal pain. Denies any dysuria. There no aggravating or alleviating factors. She denies any shortness of breath or dizziness.  The history is provided by the patient. No language interpreter was used.    History reviewed. No pertinent past medical history. History reviewed. No pertinent past surgical history. History reviewed. No pertinent family history. History  Substance Use Topics  . Smoking status: Current Every Day Smoker    Types: Cigarettes  . Smokeless tobacco: Never Used  . Alcohol Use: Yes     Comment: sometimes   OB History   Grav Para Term Preterm Abortions TAB SAB Ect Mult Living                 Review of Systems  Constitutional: Negative for fever and chills.  Respiratory: Negative for shortness of breath.   Cardiovascular: Negative for chest pain.  Gastrointestinal: Negative for nausea,  vomiting, diarrhea and constipation.       No bowel incontinence  Genitourinary: Positive for vaginal bleeding. Negative for dysuria.       No urinary incontinence  Musculoskeletal: Positive for arthralgias, back pain and myalgias.  Neurological:       No saddle anesthesia  All other systems reviewed and are negative.     Allergies  Benadryl  Home Medications   Prior to Admission medications   Medication Sig Start Date End Date Taking? Authorizing Provider  Pseudoeph-Doxylamine-DM-APAP (NYQUIL PO) Take 10 mLs by mouth once as needed (for cold symptoms).   Yes Historical Provider, MD   BP 129/76  Pulse 111  Temp(Src) 97.3 F (36.3 C)  Resp 18  Ht 4\' 11"  (1.499 m)  Wt 135 lb (61.236 kg)  BMI 27.25 kg/m2  SpO2 100%  LMP 10/05/2014 Physical Exam  Nursing note and vitals reviewed. Constitutional: She is oriented to person, place, and time. She appears well-developed and well-nourished.  HENT:  Head: Normocephalic and atraumatic.  Eyes: Conjunctivae and EOM are normal. Pupils are equal, round, and reactive to light.  Neck: Normal range of motion. Neck supple.  Cardiovascular: Normal rate and regular rhythm.  Exam reveals no gallop and no friction rub.   No murmur heard. Pulmonary/Chest: Effort normal and breath sounds normal. No respiratory distress. She has no wheezes. She has no rales. She exhibits no tenderness.  Abdominal: Soft. She exhibits no distension and no mass. There is no tenderness. There is no rebound  and no guarding.  No focal abdominal tenderness, no RLQ tenderness or pain at McBurney's point, no RUQ tenderness or Murphy's sign, no left-sided abdominal tenderness, no fluid wave, or signs of peritonitis   Genitourinary:  Pelvic exam chaperoned by female ER tech, no right or left adnexal tenderness, no uterine tenderness, no vaginal discharge, mild bleeding, no hemorrhage, no CMT or friability, no foreign body, no injury to the external genitalia, no other  significant findings    Musculoskeletal: Normal range of motion. She exhibits no edema and no tenderness.  Neurological: She is alert and oriented to person, place, and time.  Skin: Skin is warm and dry.  Psychiatric: She has a normal mood and affect. Her behavior is normal. Judgment and thought content normal.    ED Course  Procedures (including critical care time) Results for orders placed during the hospital encounter of 10/05/14  WET PREP, GENITAL      Result Value Ref Range   Yeast Wet Prep HPF POC NONE SEEN  NONE SEEN   Trich, Wet Prep NONE SEEN  NONE SEEN   Clue Cells Wet Prep HPF POC NONE SEEN  NONE SEEN   WBC, Wet Prep HPF POC FEW (*) NONE SEEN  URINALYSIS, ROUTINE W REFLEX MICROSCOPIC      Result Value Ref Range   Color, Urine AMBER (*) YELLOW   APPearance HAZY (*) CLEAR   Specific Gravity, Urine 1.031 (*) 1.005 - 1.030   pH 6.0  5.0 - 8.0   Glucose, UA NEGATIVE  NEGATIVE mg/dL   Hgb urine dipstick LARGE (*) NEGATIVE   Bilirubin Urine NEGATIVE  NEGATIVE   Ketones, ur 15 (*) NEGATIVE mg/dL   Protein, ur 30 (*) NEGATIVE mg/dL   Urobilinogen, UA 1.0  0.0 - 1.0 mg/dL   Nitrite NEGATIVE  NEGATIVE   Leukocytes, UA TRACE (*) NEGATIVE  URINE MICROSCOPIC-ADD ON      Result Value Ref Range   Squamous Epithelial / LPF MANY (*) RARE   WBC, UA 0-2  <3 WBC/hpf   RBC / HPF 7-10  <3 RBC/hpf   Bacteria, UA FEW (*) RARE   Urine-Other MUCOUS PRESENT    POC URINE PREG, ED      Result Value Ref Range   Preg Test, Ur NEGATIVE  NEGATIVE   No results found.   Imaging Review No results found.   EKG Interpretation None      MDM   Final diagnoses:  Right-sided thoracic back pain  Irregular uterine bleeding     Patient with back pain and vaginal bleeding. The 2 are unrelated. Back pain is in the upper back and is musculoskeletal. Conservative therapy only.   Regarding vaginal bleeding, the patient is NOT pregnant, she is not dizzy, vitals are stable. Doubt anemia. She is  well-appearing. Will check pelvic exam, but anticipate discharge to home with OB/GYN followup.  2:39 PM Pelvic is remarkable for a mild amount of old blood in the vaginal vault.  Wet prep is negative.  Recommend OBGYN follow-up.   Roxy Horsemanobert Julanne Schlueter, PA-C 10/05/14 [phone]

## 2014-10-05 NOTE — Discharge Planning (Signed)
[facility] Eligibility Specialist  Spoke to the patient regarding primary care resources and the [facility] orange [facility]. Patient states she has insurance but has not paid her premium at this time. Resource guide and my contact information also provided for any future questions or concerns.

## 2014-10-05 NOTE — Discharge Instructions (Signed)
Abnormal Uterine Bleeding °Abnormal uterine bleeding can affect women at various stages in life, including teenagers, women in their reproductive years, pregnant women, and women who have reached menopause. Several kinds of uterine bleeding are considered abnormal, including: °· Bleeding or spotting between periods.   °· Bleeding after sexual intercourse.   °· Bleeding that is heavier or more than normal.   °· Periods that last longer than usual. °· Bleeding after menopause.   °Many cases of abnormal uterine bleeding are minor and simple to treat, while others are more serious. Any type of abnormal bleeding should be evaluated by your health care provider. Treatment will depend on the cause of the bleeding. °HOME CARE INSTRUCTIONS °Monitor your condition for any changes. The following actions may help to alleviate any discomfort you are experiencing: °· Avoid the use of tampons and douches as directed by your health care provider. °· Change your pads frequently. °You should get regular pelvic exams and Pap tests. Keep all follow-up appointments for diagnostic tests as directed by your health care provider.  °SEEK MEDICAL CARE IF:  °· Your bleeding lasts more than 1 week.   °· You feel dizzy at times.   °SEEK IMMEDIATE MEDICAL CARE IF:  °· You pass out.   °· You are changing pads every 15 to 30 minutes.   °· You have abdominal pain. °· You have a fever.   °· You become sweaty or weak.   °· You are passing large blood clots from the vagina.   °· You start to feel nauseous and vomit. °MAKE SURE YOU:  °· Understand these instructions. °· Will watch your condition. °· Will get help right away if you are not doing well or get worse. °Document Released: 12/09/2005 Document Revised: 12/14/2013 Document Reviewed: 07/08/2013 °ExitCare® Patient Information ©2015 [employer]. This information is not intended to replace advice given to you by your health care provider. Make sure you discuss any questions you have with your  health care provider. ° °Back Pain, Adult °Low back pain is very common. About 1 in 5 people have back pain. The cause of low back pain is rarely dangerous. The pain often gets better over time. About half of people with a sudden onset of back pain feel better in just 2 weeks. About 8 in 10 people feel better by 6 weeks.  °CAUSES °Some common causes of back pain include: °· Strain of the muscles or ligaments supporting the spine. °· Wear and tear (degeneration) of the spinal discs. °· Arthritis. °· Direct injury to the back. °DIAGNOSIS °Most of the time, the direct cause of low back pain is not known. However, back pain can be treated effectively even when the exact cause of the pain is unknown. Answering your caregiver's questions about your overall health and symptoms is one of the most accurate ways to make sure the cause of your pain is not dangerous. If your caregiver needs more information, he or she may order lab work or imaging tests (X-rays or MRIs). However, even if imaging tests show changes in your back, this usually does not require surgery. °HOME CARE INSTRUCTIONS °For many people, back pain returns. Since low back pain is rarely dangerous, it is often a condition that people can learn to manage on their own.  °· Remain active. It is stressful on the back to sit or stand in one place. Do not sit, drive, or stand in one place for more than 30 minutes at a time. Take short walks on level surfaces as soon as pain allows. Try to increase the length of time you walk each day. °·   Do not stay in bed. Resting more than 1 or 2 days can delay your recovery. °· Do not avoid exercise or work. Your body is made to move. It is not dangerous to be active, even though your back may hurt. Your back will likely heal faster if you return to being active before your pain is gone. °· Pay attention to your body when you  bend and lift. Many people have less discomfort when lifting if they bend their knees, keep the load  close to their bodies, and avoid twisting. Often, the most comfortable positions are those that put less stress on your recovering back. °· Find a comfortable position to sleep. Use a firm mattress and lie on your side with your knees slightly bent. If you lie on your back, put a pillow under your knees. °· Only take over-the-counter or prescription medicines as directed by your caregiver. Over-the-counter medicines to reduce pain and inflammation are often the most helpful. Your caregiver may prescribe muscle relaxant drugs. These medicines help dull your pain so you can more quickly return to your normal activities and healthy exercise. °· Put ice on the injured area. °¨ Put ice in a plastic bag. °¨ Place a towel between your skin and the bag. °¨ Leave the ice on for 15-20 minutes, 03-04 times a day for the first 2 to 3 days. After that, ice and heat may be alternated to reduce pain and spasms. °· Ask your caregiver about trying back exercises and gentle massage. This may be of some benefit. °· Avoid feeling anxious or stressed. Stress increases muscle tension and can worsen back pain. It is important to recognize when you are anxious or stressed and learn ways to manage it. Exercise is a great option. °SEEK MEDICAL CARE IF: °· You have pain that is not relieved with rest or medicine. °· You have pain that does not improve in 1 week. °· You have new symptoms. °· You are generally not feeling well. °SEEK IMMEDIATE MEDICAL CARE IF:  °· You have pain that radiates from your back into your legs. °· You develop new bowel or bladder control problems. °· You have unusual weakness or numbness in your arms or legs. °· You develop nausea or vomiting. °· You develop abdominal pain. °· You feel faint. °Document Released: 12/09/2005 Document Revised: 06/09/2012 Document Reviewed: 04/12/2014 °ExitCare® Patient Information ©2015 Exit[employer] LLC. This information is not intended to replace advice given to you by your health care  provider. Make sure you discuss any questions you have with your health care provider. ° °

## 2014-10-06 LAB — GC/CHLAMYDIA PROBE AMP
CT PROBE, AMP APTIMA: NEGATIVE
GC PROBE AMP APTIMA: NEGATIVE

## 2014-12-09 ENCOUNTER — Emergency Department (HOSPITAL_COMMUNITY)
Admission: EM | Admit: 2014-12-09 | Discharge: 2014-12-09 | Disposition: A | Discharge status: Home / Self Care | Payer: Self-pay | Attending: Emergency Medicine | Admitting: Emergency Medicine

## 2014-12-09 ENCOUNTER — Encounter (HOSPITAL_COMMUNITY): Payer: Self-pay | Admitting: *Deleted

## 2014-12-09 DIAGNOSIS — Z79899 Other long term (current) drug therapy: Secondary | ICD-10-CM | POA: Insufficient documentation

## 2014-12-09 DIAGNOSIS — J069 Acute upper respiratory infection, unspecified: Secondary | ICD-10-CM | POA: Insufficient documentation

## 2014-12-09 DIAGNOSIS — Z720 Tobacco use: Secondary | ICD-10-CM | POA: Insufficient documentation

## 2014-12-09 DIAGNOSIS — M545 Low back pain, unspecified: Secondary | ICD-10-CM

## 2014-12-09 MED ORDER — IBUPROFEN 600 MG PO TABS
600.0000 mg | ORAL_TABLET | Freq: Four times a day (QID) | ORAL | Status: DC | PRN
Start: 2014-12-09 — End: 2015-01-21

## 2014-12-09 NOTE — ED Provider Notes (Signed)
CSN: [id number]     Arrival date & time 12/09/14  1208 History  This chart was scribed for Wendy HelperBowie Reta Norgren, PA-C, working with Wendy MochaBlair Walden, MD found by Elon SpannerGarrett Cook, ED Scribe. This patient was seen in room WTR5/WTR5 and the patient's care was started at 1:13 PM.  Chief Complaint  Patient presents with  . Back Pain  . Sore Throat   HPI HPI Comments: Wendy Carpenter is a 23 y.o. female who presents to the Emergency Department complaining of non-radiating, aching intermittent lower back pain onset 1 week ago without injury.  Pain is aggravated by motion, specifically moving from sitting to standing.  Patient has taken ibuprofen "every now and then" with relief "sometimes".  Patient denies history of IV drug use.  Patient denies history of cancer.  Patient denies hematuria, dysuria, bowel/bladder incontinence, numbness, fever, rash, swelling, injury  Patient also complains of a sore throat with associated sneezing and cough. History reviewed. No pertinent past medical history. History reviewed. No pertinent past surgical history. No family history on file. History  Substance Use Topics  . Smoking status: Current Every Day Smoker    Types: Cigarettes  . Smokeless tobacco: Never Used  . Alcohol Use: Yes     Comment: sometimes   OB History    No data available     Review of Systems  Constitutional: Negative for fever.  Genitourinary: Negative for dysuria and hematuria.  Musculoskeletal: Positive for back pain.  Skin: Negative for rash.  Neurological: Negative for numbness.      Allergies  Benadryl  Home Medications   Prior to Admission medications   Medication Sig Start Date End Date Taking? Authorizing Provider  cyclobenzaprine (FLEXERIL) 10 MG tablet Take 1 tablet (10 mg total) by mouth 2 (two) times daily as needed for muscle spasms. 10/05/14   Roxy Horsemanobert Browning, PA-C  ibuprofen (ADVIL,MOTRIN) 600 MG tablet Take 1 tablet (600 mg total) by mouth every 6 (six) hours as needed.  10/05/14   Roxy Horsemanobert Browning, PA-C  Pseudoeph-Doxylamine-DM-APAP (NYQUIL PO) Take 10 mLs by mouth once as needed (for cold symptoms).    Historical Provider, MD   BP 117/69 mmHg  Pulse 96  Temp(Src) 98.8 F (37.1 C) (Oral)  Resp 14  SpO2 96%  LMP 11/17/2014 Physical Exam  Constitutional: She is oriented to person, place, and time. She appears well-developed and well-nourished. No distress.  HENT:  Head: Normocephalic and atraumatic.  Eyes: Conjunctivae and EOM are normal.  Neck: Neck supple. No tracheal deviation present.  Cardiovascular: Normal rate.   Pulmonary/Chest: Effort normal. No respiratory distress.  Musculoskeletal: Normal range of motion.  Paralumbar spinal muscle tenderness without any overlying skin changes.  Normal gait.    Neurological: She is alert and oriented to person, place, and time.  Skin: Skin is warm and dry.  Psychiatric: She has a normal mood and affect. Her behavior is normal.  Nursing note and vitals reviewed.   ED Course  Procedures (including critical care time)  DIAGNOSTIC STUDIES: Oxygen Saturation is 96% on RA, normal by my interpretation.    COORDINATION OF CARE:  1:27 PM Advised patient to use heat, massage, and ibuprofen as needed for back.  Advsied patient to gargle salt water for throat.  Patient acknowledges and agrees with plan.    Labs Review Labs Reviewed - No data to display  Imaging Review No results found.   EKG Interpretation None      MDM   Final diagnoses:  Bilateral low back pain without sciatica  URI (upper respiratory infection)    BP 117/69 mmHg  Pulse 96  Temp(Src) 98.8 F (37.1 C) (Oral)  Resp 14  SpO2 96%  LMP 11/17/2014   I personally performed the services described in this documentation, which was scribed in my presence. The recorded information has been reviewed and is accurate.      Wendy HelperBowie Clyde Zarrella, PA-C 12/09/14 1353  Wendy MochaBlair Walden, MD 12/09/14 [phone]

## 2014-12-09 NOTE — ED Notes (Signed)
Patient c/o low and middle back pain x1 week.  Patient states the pain may be related to her employment at [employer].  Patient states the pain is worse when she is rising from a seated position.  Patient denies urinary frequency, urgency and hematuria.  Patient states her throat has been hurting since this past Tuesday and she has nasal congestion.  Mild erythema noted to patient's oropharynx.  No pustules or swelling noted, airway is patent.  Patient denies fever and vomiting, but states she had an episode of diarrhea last night.

## 2014-12-09 NOTE — Discharge Instructions (Signed)
Upper Respiratory Infection, Adult An upper respiratory infection (URI) is also known as the common cold. It is often caused by a type of germ (virus). Colds are easily spread (contagious). You can pass it to others by kissing, coughing, sneezing, or drinking out of the same glass. Usually, you get better in 1 or 2 weeks.  HOME CARE   Only take medicine as told by your doctor.  Use a warm mist humidifier or breathe in steam from a hot shower.  Drink enough water and fluids to keep your pee (urine) clear or pale yellow.  Get plenty of rest.  Return to work when your temperature is back to normal or as told by your doctor. You may use a face mask and wash your hands to stop your cold from spreading. GET HELP RIGHT AWAY IF:   After the first few days, you feel you are getting worse.  You have questions about your medicine.  You have chills, shortness of breath, or brown or red spit (mucus).  You have yellow or brown snot (nasal discharge) or pain in the face, especially when you bend forward.  You have a fever, puffy (swollen) neck, pain when you swallow, or white spots in the back of your throat.  You have a bad headache, ear pain, sinus pain, or chest pain.  You have a high-pitched whistling sound when you breathe in and out (wheezing).  You have a lasting cough or cough up blood.  You have sore muscles or a stiff neck. MAKE SURE YOU:   Understand these instructions.  Will watch your condition.  Will get help right away if you are not doing well or get worse. Document Released: 05/27/2008 Document Revised: 03/02/2012 Document Reviewed: 03/16/2014 ExitCare Patient Information 2015 [employer]. This information is not intended to replace advice given to you by your health care provider. Make sure you discuss any questions you have with your health care provider.  

## 2015-01-21 ENCOUNTER — Emergency Department (HOSPITAL_COMMUNITY)
Admission: EM | Admit: 2015-01-21 | Discharge: 2015-01-21 | Disposition: A | Discharge status: Home / Self Care | Payer: Self-pay | Attending: Emergency Medicine | Admitting: Emergency Medicine

## 2015-01-21 ENCOUNTER — Encounter (HOSPITAL_COMMUNITY): Payer: Self-pay | Admitting: Emergency Medicine

## 2015-01-21 DIAGNOSIS — S61011A Laceration without foreign body of right thumb without damage to nail, initial encounter: Secondary | ICD-10-CM | POA: Insufficient documentation

## 2015-01-21 DIAGNOSIS — S61209A Unspecified open wound of unspecified finger without damage to nail, initial encounter: Secondary | ICD-10-CM

## 2015-01-21 DIAGNOSIS — Y998 Other external cause status: Secondary | ICD-10-CM | POA: Insufficient documentation

## 2015-01-21 DIAGNOSIS — Y9289 Other specified places as the place of occurrence of the external cause: Secondary | ICD-10-CM | POA: Insufficient documentation

## 2015-01-21 DIAGNOSIS — Z720 Tobacco use: Secondary | ICD-10-CM | POA: Insufficient documentation

## 2015-01-21 DIAGNOSIS — W260XXA Contact with knife, initial encounter: Secondary | ICD-10-CM | POA: Insufficient documentation

## 2015-01-21 DIAGNOSIS — Y9389 Activity, other specified: Secondary | ICD-10-CM | POA: Insufficient documentation

## 2015-01-21 MED ORDER — BACITRACIN ZINC 500 UNIT/GM EX OINT
1.0000 "application " | TOPICAL_OINTMENT | CUTANEOUS | Status: AC
  Administered 2015-01-21: 1 via TOPICAL

## 2015-01-21 MED ORDER — BACITRACIN ZINC 500 UNIT/GM EX OINT
TOPICAL_OINTMENT | CUTANEOUS | Status: AC
  Filled 2015-01-21: qty 1.8

## 2015-01-21 MED ORDER — IBUPROFEN 600 MG PO TABS: 600.0000 mg | ORAL_TABLET | Freq: Four times a day (QID) | ORAL | Status: DC | PRN

## 2015-01-21 NOTE — ED Notes (Signed)
Patient was peeling potatoes and cut her right thumb with a knife. Unsure of last tetanus shot. No bleeding noted at this time. Has not taken anything for pain today. Not on blood thinners. No other c/c.

## 2015-01-21 NOTE — Discharge Instructions (Signed)
Keep wound clean and dry. Bacitracin twice a day. Keep it covered until healed. Follow up with your doctor as needed.   Fingertip Injuries and Amputations Fingertip injuries are common and often get injured because they are last to escape when pulling your hand out of harm's way. You have amputated (cut off) part of your finger. How this turns out depends largely on how much was amputated. If just the tip is amputated, often the end of the finger will grow back and the finger may return to much the same as it was before the injury.  If more of the finger is missing, your caregiver has done the best with the tissue remaining to allow you to keep as much finger as is possible. Your caregiver after checking your injury has tried to leave you with a painless fingertip that has durable, feeling skin. If possible, your caregiver has tried to maintain the finger's length and appearance and preserve its fingernail.  Please read the instructions outlined below and refer to this sheet in the next few weeks. These instructions provide you with general information on caring for yourself. Your caregiver may also give you specific instructions. While your treatment has been done according to the most current medical practices available, unavoidable complications occasionally occur. If you have any problems or questions after discharge, please call your caregiver. HOME CARE INSTRUCTIONS   You may resume normal diet and activities as directed or allowed.  Keep your hand elevated above the level of your heart. This helps decrease pain and swelling.  Keep ice packs (or a bag of ice wrapped in a towel) on the injured area for 15-20 minutes, 03-04 times per day, for the first two days.  Change dressings if necessary or as directed.  Clean the wound daily or as directed.  Only take over-the-counter or prescription medicines for pain, discomfort, or fever as directed by your caregiver.  Keep appointments as  directed. SEEK IMMEDIATE MEDICAL CARE IF:  You develop redness, swelling, numbness or increasing pain in the wound.  There is pus coming from the wound.  You develop an unexplained oral temperature above 102 F (38.9 C) or as your caregiver suggests.  There is a foul (bad) smell coming from the wound or dressing.  There is a breaking open of the wound (edges not staying together) after sutures or staples have been removed. MAKE SURE YOU:   Understand these instructions.  Will watch your condition.  Will get help right away if you are not doing well or get worse. Document Released: 10/30/2005 Document Revised: 03/02/2012 Document Reviewed: 09/28/2008 [facility] Patient Information 2015 [employer] [place]. This information is not intended to replace advice given to you by your health care provider. Make sure you discuss any questions you have with your health care provider.

## 2015-01-21 NOTE — ED Provider Notes (Signed)
CSN: [id number]     Arrival date & time 01/21/15  2219 History   First MD Initiated Contact with Patient 01/21/15 2251     Chief Complaint  Patient presents with  . Laceration    right thumb     (Consider location/radiation/quality/duration/timing/severity/associated sxs/prior Treatment) HPI Wendy Carpenter is a 24 y.o. female with no medical problems, presents to emergency department complaining of laceration to the right thumb. Patient states she was cutting potatoes with a knife, when it slipped and cut tip of her thumb. She presents with a small, less than half a centimeter area to the tip of the thumb, sliced off. Patient states that her last tetanus was a year ago. She states that the laceration is very painful. It is not bleeding. She states she tried to wash it with water but it was too painful. No other treatments prior to her arrival.  History reviewed. No pertinent past medical history. History reviewed. No pertinent past surgical history. History reviewed. No pertinent family history. History  Substance Use Topics  . Smoking status: Current Every Day Smoker    Types: Cigarettes  . Smokeless tobacco: Never Used  . Alcohol Use: Yes     Comment: sometimes   OB History    No data available     Review of Systems  Constitutional: Negative for fever and chills.  Skin: Positive for wound.  Neurological: Negative for weakness and numbness.      Allergies  Benadryl  Home Medications   Prior to Admission medications   Medication Sig Start Date End Date Taking? Authorizing Provider  cyclobenzaprine (FLEXERIL) 10 MG tablet Take 1 tablet (10 mg total) by mouth 2 (two) times daily as needed for muscle spasms. Patient not taking: Reported on 01/21/2015 10/05/14   Roxy Horsemanobert Browning, PA-C  ibuprofen (ADVIL,MOTRIN) 600 MG tablet Take 1 tablet (600 mg total) by mouth every 6 (six) hours as needed for moderate pain. Patient not taking: Reported on 01/21/2015 12/09/14   Fayrene HelperBowie Tran,  PA-C   BP 136/87 mmHg  Pulse 103  Temp(Src) 97.9 F (36.6 C) (Oral)  Resp 16  SpO2 100%  LMP 01/15/2015 Physical Exam  Constitutional: She appears well-developed and well-nourished. No distress.  Eyes: Conjunctivae are normal.  Neck: Neck supple.  Musculoskeletal:       Hands: Neurological: She is alert.  Skin: Skin is warm and dry.  Less than 0.5cm laceration/skin avulsion to the tip of the right thumb. Hemostatic.   Nursing note and vitals reviewed.   ED Course  Procedures (including critical care time) Labs Review Labs Reviewed - No data to display  Imaging Review No results found.   EKG Interpretation None      MDM   Final diagnoses:  Avulsion, finger tip, initial encounter    patient with a very small tip avulsion of the right thumb. Just the top layer of the skin avulsed. The wound is hemostatic. Thoroughly irrigated with saline and soaked in saline for 5 minutes. Bacitracin, dressing applied. Home with close follow-up as needed. Ibuprofen or Tylenol for pain at home. Bacitracin topically twice a day to prevent infection. Patient states that her tetanus is up-to-date.   Filed Vitals:   01/21/15 2226  BP: 136/87  Pulse: 103  Temp: 97.9 F (36.6 C)  TempSrc: Oral  Resp: 16  SpO2: 100%     Myriam Jacobsonatyana A Aizlynn Digilio, PA-C 01/22/15 0025  Tomasita CrumbleAdeleke Oni, MD 01/22/15 [phone]

## 2015-10-13 ENCOUNTER — Emergency Department (HOSPITAL_COMMUNITY)
Admission: EM | Admit: 2015-10-13 | Discharge: 2015-10-13 | Disposition: A | Discharge status: Home / Self Care | Payer: Self-pay | Attending: Emergency Medicine | Admitting: Emergency Medicine

## 2015-10-13 ENCOUNTER — Encounter (HOSPITAL_COMMUNITY): Payer: Self-pay | Admitting: *Deleted

## 2015-10-13 DIAGNOSIS — Z202 Contact with and (suspected) exposure to infections with a predominantly sexual mode of transmission: Secondary | ICD-10-CM | POA: Insufficient documentation

## 2015-10-13 DIAGNOSIS — R21 Rash and other nonspecific skin eruption: Secondary | ICD-10-CM | POA: Insufficient documentation

## 2015-10-13 DIAGNOSIS — Z720 Tobacco use: Secondary | ICD-10-CM | POA: Insufficient documentation

## 2015-10-13 NOTE — ED Provider Notes (Signed)
CSN: [id number]     Arrival date & time 10/13/15  1234 History  By signing my name below, I, Placido Sou, attest that this documentation has been prepared under the direction and in the presence of Fayrene Helper, PA-C. Electronically Signed: Placido Sou, ED Scribe. 10/13/2015. 1:26 PM.   Chief Complaint  Patient presents with  . Rash  . Exposure to STD   The history is provided by the patient. No language interpreter was used.    HPI Comments: Wendy Carpenter is a 24 y.o. female who presents to the Emergency Department complaining of a mild rash to her bilateral hands with onset 2 days ago. Pt notes that she was washing her hands and noticed the rash but denies any associated pain, itchiness or a spreading of the affected region. Pt notes taking 1x benadryl 1 day ago as well as applying OTC hydrocortisone cream which provided some mild relief. Pt denies any new detergents, soaps or foods. She notes a hx of eczema but denies any hx of this type of rash. She denies HA, throat swelling, abd pain, n/v, fever, chills, rhinorrhea and body aches.   Initially pt was concern about STD because her partner has expressed "he was feeling some kind of way". However, pt [facility].  Therefore, no STD check performs today.  History reviewed. No pertinent past medical history. History reviewed. No pertinent past surgical history. No family history on file. Social History  Substance Use Topics  . Smoking status: Current Every Day Smoker    Types: Cigarettes  . Smokeless tobacco: Never Used  . Alcohol Use: Yes     Comment: sometimes   OB History    No data available     Review of Systems  Constitutional: Negative for fever and chills.  HENT: Negative for rhinorrhea and sore throat.   Gastrointestinal: Negative for nausea, vomiting and abdominal pain.  Musculoskeletal: Negative for myalgias.  Skin: Positive for color  change and rash.  Neurological: Negative for headaches.   Allergies  Benadryl  Home Medications   Prior to Admission medications   Medication Sig Start Date End Date Taking? Authorizing Provider  cyclobenzaprine (FLEXERIL) 10 MG tablet Take 1 tablet (10 mg total) by mouth 2 (two) times daily as needed for muscle spasms. Patient not taking: Reported on 01/21/2015 10/05/14   Roxy Horseman, PA-C  ibuprofen (ADVIL,MOTRIN) 600 MG tablet Take 1 tablet (600 mg total) by mouth every 6 (six) hours as needed. 01/21/15   Tatyana Kirichenko, PA-C   BP 166/85 mmHg  Pulse 96  Temp(Src) 98.7 F (37.1 C) (Oral)  Resp 16  Ht  (1.499 m)  Wt 140 lb (63.504 kg)  BMI 28.26 kg/m2  SpO2 100%  LMP 10/13/2015 Physical Exam  Constitutional: She is oriented to person, place, and time. She appears well-developed and well-nourished.  HENT:  Head: Normocephalic and atraumatic.  Mouth/Throat: Oropharynx is clear and moist. No oropharyngeal exudate.  Neck: Normal range of motion. No tracheal deviation present.  Cardiovascular: Normal rate, regular rhythm and normal heart sounds.  Exam reveals no gallop and no friction rub.   No murmur heard. Pulmonary/Chest: Effort normal. No respiratory distress. She has no wheezes. She has no rales. She exhibits no tenderness.  Abdominal: Soft. There is no tenderness.  Musculoskeletal: Normal range of motion.  Neurological: She is alert and oriented to person, place, and time.  Skin: Skin is warm and dry.  She is not diaphoretic.  Sand like rash noted to dorsum of bilateral hands; does not appear infected; brisk cap refill; no vesicular or petechial lesion; radial pulses 2+ bilaterally; no rash to palms or bilateral feet  Psychiatric: She has a normal mood and affect. Her behavior is normal.  Nursing note and vitals reviewed.  ED Course  Procedures  DIAGNOSTIC STUDIES: Oxygen Saturation is 100% on RA, normal by my interpretation.    COORDINATION OF CARE: 1:23  PM Rash does not appear infected.  No red flags.  Discussed treatment plan with pt at bedside including reasons for a re-evaluation and recommended continuance of OTC treatment plan including hydrocortisone and benadryl. Dermatology referral as needed.  Return precaution discussed.  Pt agreed to plan.    MDM   Final diagnoses:  Rash and nonspecific skin eruption    BP 166/85 mmHg  Pulse 96  Temp(Src) 98.7 F (37.1 C) (Oral)  Resp 16  Ht 4\' 11"  (1.499 m)  Wt 140 lb (63.504 kg)  BMI 28.26 kg/m2  SpO2 100%  LMP 10/13/2015  I, Reyce Lubeck, personally performed the services described in this documentation. All medical record entries made by the scribe were at my direction and in my presence.  I have reviewed the chart and discharge instructions and agree that the record reflects my personal performance and is accurate and complete. Cassady Turano.  10/13/2015. 1:33 PM.     Fayrene HelperBowie Jahrell Hamor, PA-C 10/13/15 1335  Lorre NickAnthony Allen, MD 10/24/15 [phone]

## 2015-10-13 NOTE — Discharge Instructions (Signed)

## 2015-10-13 NOTE — ED Notes (Signed)
Pt here for rash on top of hands-no pain or itch-started Wednesday.  Pt wants to be checked for STDs but has no symptoms of vaginal discharge.  Pt just started menstrual today.

## 2015-10-13 NOTE — ED Notes (Signed)
Patient states having no symptoms of STD, but her friend told her "he was feeling some kind of way".   Patient states her friend told her that he had "talked to the doctor over the phone and he told him he might have something, but he didn't know".  Patient states she has an appointment on [facility].

## 2017-06-22 ENCOUNTER — Emergency Department (HOSPITAL_COMMUNITY)
Admission: EM | Admit: 2017-06-22 | Discharge: 2017-06-22 | Disposition: A | Discharge status: Home / Self Care | Payer: Self-pay | Attending: Emergency Medicine | Admitting: Emergency Medicine

## 2017-06-22 ENCOUNTER — Encounter (HOSPITAL_COMMUNITY): Payer: Self-pay | Admitting: *Deleted

## 2017-06-22 DIAGNOSIS — F17210 Nicotine dependence, cigarettes, uncomplicated: Secondary | ICD-10-CM | POA: Insufficient documentation

## 2017-06-22 DIAGNOSIS — Z79899 Other long term (current) drug therapy: Secondary | ICD-10-CM | POA: Insufficient documentation

## 2017-06-22 DIAGNOSIS — K2960 Other gastritis without bleeding: Secondary | ICD-10-CM | POA: Insufficient documentation

## 2017-06-22 DIAGNOSIS — R112 Nausea with vomiting, unspecified: Secondary | ICD-10-CM | POA: Insufficient documentation

## 2017-06-22 LAB — URINALYSIS, ROUTINE W REFLEX MICROSCOPIC
BILIRUBIN URINE: NEGATIVE
Bacteria, UA: NONE SEEN
GLUCOSE, UA: NEGATIVE mg/dL
KETONES UR: 5 mg/dL — AB
LEUKOCYTES UA: NEGATIVE
Nitrite: NEGATIVE
PH: 8 (ref 5.0–8.0)
Protein, ur: 100 mg/dL — AB
Specific Gravity, Urine: 1.023 (ref 1.005–1.030)

## 2017-06-22 LAB — COMPREHENSIVE METABOLIC PANEL
ALT: 21 U/L (ref 14–54)
ANION GAP: 9 (ref 5–15)
AST: 27 U/L (ref 15–41)
Albumin: 4.5 g/dL (ref 3.5–5.0)
Alkaline Phosphatase: 55 U/L (ref 38–126)
BUN: 8 mg/dL (ref 6–20)
CHLORIDE: 107 mmol/L (ref 101–111)
CO2: 21 mmol/L — ABNORMAL LOW (ref 22–32)
CREATININE: 0.75 mg/dL (ref 0.44–1.00)
Calcium: 9.4 mg/dL (ref 8.9–10.3)
Glucose, Bld: 86 mg/dL (ref 65–99)
POTASSIUM: 3.9 mmol/L (ref 3.5–5.1)
Sodium: 137 mmol/L (ref 135–145)
Total Bilirubin: 0.6 mg/dL (ref 0.3–1.2)
Total Protein: 7.9 g/dL (ref 6.5–8.1)

## 2017-06-22 LAB — CBC
HCT: 38.4 % (ref 36.0–46.0)
Hemoglobin: 12.8 g/dL (ref 12.0–15.0)
MCH: 25.6 pg — ABNORMAL LOW (ref 26.0–34.0)
MCHC: 33.3 g/dL (ref 30.0–36.0)
MCV: 76.8 fL — AB (ref 78.0–100.0)
PLATELETS: 295 10*3/uL (ref 150–400)
RBC: 5 MIL/uL (ref 3.87–5.11)
RDW: 14.1 % (ref 11.5–15.5)
WBC: 5.4 10*3/uL (ref 4.0–10.5)

## 2017-06-22 LAB — I-STAT BETA HCG BLOOD, ED (MC, WL, AP ONLY): I-stat hCG, quantitative: <5 m[IU]/mL (ref <5)

## 2017-06-22 LAB — LIPASE, BLOOD: LIPASE: 22 U/L (ref 11–51)

## 2017-06-22 MED ORDER — SUCRALFATE 1 G PO TABS: 1.0000 g | ORAL_TABLET | Freq: Three times a day (TID) | ORAL | 0 refills | Status: AC

## 2017-06-22 MED ORDER — PROMETHAZINE HCL 25 MG PO TABS: 25.0000 mg | ORAL_TABLET | Freq: Four times a day (QID) | ORAL | 0 refills | Status: AC | PRN

## 2017-06-22 MED ORDER — GI COCKTAIL ~~LOC~~
30.0000 mL | Freq: Once | ORAL | Status: AC
  Administered 2017-06-22: 30 mL via ORAL
  Filled 2017-06-22: qty 30

## 2017-06-22 MED ORDER — FAMOTIDINE 20 MG PO TABS: 20.0000 mg | ORAL_TABLET | Freq: Two times a day (BID) | ORAL | 0 refills | Status: AC

## 2017-06-22 MED ORDER — FAMOTIDINE 20 MG PO TABS
20.0000 mg | ORAL_TABLET | Freq: Once | ORAL | Status: AC
  Administered 2017-06-22: 20 mg via ORAL
  Filled 2017-06-22: qty 1

## 2017-06-22 MED ORDER — ONDANSETRON 4 MG PO TBDP
4.0000 mg | ORAL_TABLET | Freq: Once | ORAL | Status: AC
  Administered 2017-06-22: 4 mg via ORAL
  Filled 2017-06-22: qty 1

## 2017-06-22 NOTE — ED Provider Notes (Signed)
[facility]-EMERGENCY DEPT Provider Note   CSN: [id number] Arrival date & time: 06/22/17  1352     History   Chief Complaint Chief Complaint  Patient presents with  . Abdominal Pain  . Emesis    HPI  Blood pressure 134/83, pulse 71, temperature 98.2 F (36.8 C), temperature source Oral, resp. rate 18, height 4\' 11"  (1.499 m), weight 64 kg (141 lb), last menstrual period 06/22/2017, SpO2 100 %.  Wendy Carpenter is a 26 y.o. female complaining of 2 weeks of intermittent upper abdominal pain radiating up to the chest with intermittent emesis. She states that today she threw up 4 times, it was not coffee ground or ileus. She states that that is atypical for her she did have one alcohol drink last night she states that she is not hung over. She denies fevers, chills, change in bowel or bladder habits. She states that the pain is not exacerbated postprandially. A single episode of constipation earlier in the week she took magnesium citrate not resolved issue. States that the pain is 7 out of 10 in no exacerbating or alleviating factors are identified.  History reviewed. No pertinent past medical history.  There are no active problems to display for this patient.   History reviewed. No pertinent surgical history.  OB History    No data available       Home Medications    Prior to Admission medications   Medication Sig Start Date End Date Taking? Authorizing Provider  Aspirin-Salicylamide-Caffeine (BC HEADACHE POWDER PO) Take 1 packet by mouth daily as needed (headache/pain).   Yes [provider]  cyclobenzaprine (FLEXERIL) 10 MG tablet Take 1 tablet (10 mg total) by mouth 2 (two) times daily as needed for muscle spasms. Patient not taking: Reported on 01/21/2015 10/05/14   Roxy HorsemanBrowning, Robert, PA-C  famotidine (PEPCID) 20 MG tablet Take 1 tablet (20 mg total) by mouth 2 (two) times daily. 06/22/17   Tyria Springer, Joni ReiningNicole, PA-C  ibuprofen (ADVIL,MOTRIN) 600 MG tablet Take 1 tablet (600  mg total) by mouth every 6 (six) hours as needed. Patient not taking: Reported on 06/22/2017 01/21/15   Jaynie CrumbleKirichenko, Tatyana, PA-C  promethazine (PHENERGAN) 25 MG tablet Take 1 tablet (25 mg total) by mouth every 6 (six) hours as needed for nausea or vomiting. 06/22/17   Marjarie Irion, Joni ReiningNicole, PA-C  sucralfate (CARAFATE) 1 g tablet Take 1 tablet (1 g total) by mouth 4 (four) times daily -  with meals and at bedtime. 06/22/17   Delwyn Scoggin, Mardella LaymanNicole, PA-C    Family History History reviewed. No pertinent family history.  Social History Social History  Substance Use Topics  . Smoking status: Current Every Day Smoker    Types: Cigarettes  . Smokeless tobacco: Never Used  . Alcohol use Yes     Comment: sometimes     Allergies   Benadryl [diphenhydramine hcl]   Review of Systems Review of Systems  A complete review of systems was obtained and all systems are negative except as noted in the HPI and PMH.   Physical Exam Updated Vital Signs BP 126/74   Pulse 69   Temp 98.2 F (36.8 C) (Oral)   Resp 18   Ht 4\' 11"  (1.499 m)   Wt 64 kg (141 lb)   LMP 06/22/2017   SpO2 100%   BMI 28.48 kg/m   Physical Exam  Constitutional: She is oriented to person, place, and time. She appears well-developed and well-nourished. No distress.  HENT:  Head: Normocephalic and atraumatic.  Mouth/Throat: Oropharynx  is clear and moist.  Eyes: Conjunctivae and EOM are normal. Pupils are equal, round, and reactive to light.  Neck: Normal range of motion.  Cardiovascular: Normal rate, regular rhythm and intact distal pulses.   Pulmonary/Chest: Effort normal and breath sounds normal.  Abdominal: Soft. There is tenderness.  Normoactive bowel sounds, mild tennis palpation on the left upper and left lower quadrant, Murphy sign negative. No guarding or rebound.  Musculoskeletal: Normal range of motion.  Neurological: She is alert and oriented to person, place, and time.  Skin: She is not diaphoretic.  Psychiatric: She  has a normal mood and affect.  Nursing note and vitals reviewed.    ED Treatments / Results  Labs (all labs ordered are listed, but only abnormal results are displayed) Labs Reviewed  COMPREHENSIVE METABOLIC PANEL - Abnormal; Notable for the following:       Result Value   CO2 21 (*)    All other components within normal limits  CBC - Abnormal; Notable for the following:    MCV 76.8 (*)    MCH 25.6 (*)    All other components within normal limits  URINALYSIS, ROUTINE W REFLEX MICROSCOPIC - Abnormal; Notable for the following:    Hgb urine dipstick MODERATE (*)    Ketones, ur 5 (*)    Protein, ur 100 (*)    Squamous Epithelial / LPF 0-5 (*)    All other components within normal limits  LIPASE, BLOOD  I-STAT BETA HCG BLOOD, ED ([facility], [facility], [facility] ONLY)    EKG  EKG Interpretation None       Radiology No results found.  Procedures Procedures (including critical care time)  Medications Ordered in ED Medications  ondansetron (ZOFRAN-ODT) disintegrating tablet 4 mg (4 mg Oral Given 06/22/17 1535)  gi cocktail (Maalox,Lidocaine,Donnatal) (30 mLs Oral Given 06/22/17 1535)  famotidine (PEPCID) tablet 20 mg (20 mg Oral Given 06/22/17 1535)     Initial Impression / Assessment and Plan / ED Course  I have reviewed the triage vital signs and the nursing notes.  Pertinent labs & imaging results that were available during my care of the patient were reviewed by me and considered in my medical decision making (see chart for details).    Vitals:   06/22/17 1530 06/22/17 1545 06/22/17 1630 06/22/17 1700  BP: (!) 113/98 134/89 124/87 126/74  Pulse: 83 65 79 69  Resp:      Temp:      TempSrc:      SpO2: 99% 100% 98% 100%  Weight:      Height:        Medications  ondansetron (ZOFRAN-ODT) disintegrating tablet 4 mg (4 mg Oral Given 06/22/17 1535)  gi cocktail (Maalox,Lidocaine,Donnatal) (30 mLs Oral Given 06/22/17 1535)  famotidine (PEPCID) tablet 20 mg (20 mg Oral Given 06/22/17 1535)     Wendy Carpenter is 26 y.o. female presenting with Intermittent abdominal pain nonfocal in the upper quadrants over the course of month with intermittent emesis and she also had 4 episodes of emesis today. Abdominal exam is nonsurgical. This does not appear to be a cholecystitis. Blood work is reassuring. This is likely reflux with possible H. pylori. Advised her we cannot test was in the ED. She feels much better after GI cocktail and Pepcid. We will print out information on food choices for reflux and asked her to follow closely with primary care.  Evaluation does not show pathology that would require ongoing emergent intervention or inpatient treatment. Pt is hemodynamically stable  and mentating appropriately. Discussed findings and plan with patient/guardian, who agrees with care plan. All questions answered. Return precautions discussed and outpatient follow up given.    Final Clinical Impressions(s) / ED Diagnoses   Final diagnoses:  Reflux gastritis    New Prescriptions New Prescriptions   FAMOTIDINE (PEPCID) 20 MG TABLET    Take 1 tablet (20 mg total) by mouth 2 (two) times daily.   PROMETHAZINE (PHENERGAN) 25 MG TABLET    Take 1 tablet (25 mg total) by mouth every 6 (six) hours as needed for nausea or vomiting.   SUCRALFATE (CARAFATE) 1 G TABLET    Take 1 tablet (1 g total) by mouth 4 (four) times daily -  with meals and at bedtime.     Kaylyn Lim 06/22/17 1714    Lorre Nick, MD 06/23/17 [phone]

## 2017-06-22 NOTE — Discharge Instructions (Signed)
Please follow with your primary care doctor in the next 2 days for a check-up. They must obtain records for further management.  ° °Do not hesitate to return to the Emergency Department for any new, worsening or concerning symptoms.  ° °

## 2017-06-22 NOTE — ED Triage Notes (Signed)
Pt reports upper abd discomfort with n/v today. Denies diarrhea.

## 2017-06-22 NOTE — ED Notes (Signed)
Pt given ice water to drink. Pt states she "feels better."

## 2017-06-22 NOTE — ED Notes (Addendum)
Explained to pt need for urine specimen, pt verbalized understanding but reports she does not have to urinate at this time.

## 2018-01-26 ENCOUNTER — Encounter (HOSPITAL_COMMUNITY): Payer: Self-pay

## 2018-01-26 ENCOUNTER — Emergency Department (HOSPITAL_COMMUNITY)
Admission: EM | Admit: 2018-01-26 | Discharge: 2018-01-26 | Disposition: A | Discharge status: Home / Self Care | Payer: No Typology Code available for payment source | Attending: Emergency Medicine | Admitting: Emergency Medicine

## 2018-01-26 ENCOUNTER — Other Ambulatory Visit: Payer: Self-pay

## 2018-01-26 DIAGNOSIS — Z5321 Procedure and treatment not carried out due to patient leaving prior to being seen by health care provider: Secondary | ICD-10-CM | POA: Diagnosis not present

## 2018-01-26 DIAGNOSIS — Z041 Encounter for examination and observation following transport accident: Secondary | ICD-10-CM | POA: Diagnosis present
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
## 2018-01-26 NOTE — ED Notes (Signed)
Pt on the phone with her insurance company at this time. Triage delayed.

## 2018-01-27 ENCOUNTER — Other Ambulatory Visit: Payer: Self-pay

## 2018-01-27 ENCOUNTER — Ambulatory Visit (HOSPITAL_COMMUNITY)
Admission: EM | Admit: 2018-01-27 | Discharge: 2018-01-27 | Disposition: A | Discharge status: Home / Self Care | Payer: Self-pay | Attending: Physician Assistant | Admitting: Physician Assistant

## 2018-01-27 ENCOUNTER — Emergency Department (HOSPITAL_COMMUNITY)
Admission: EM | Admit: 2018-01-27 | Discharge: 2018-01-27 | Discharge status: Absent without leave | Payer: No Typology Code available for payment source

## 2018-01-27 ENCOUNTER — Encounter (HOSPITAL_COMMUNITY): Payer: Self-pay

## 2018-01-27 DIAGNOSIS — M546 Pain in thoracic spine: Secondary | ICD-10-CM

## 2018-01-27 MED ORDER — CYCLOBENZAPRINE HCL 10 MG PO TABS
5.0000 mg | ORAL_TABLET | Freq: Three times a day (TID) | ORAL | 0 refills | Status: DC | PRN
Start: 2018-01-27 — End: 2018-01-28

## 2018-01-27 MED ORDER — IBUPROFEN 800 MG PO TABS: 800.0000 mg | ORAL_TABLET | Freq: Three times a day (TID) | ORAL | 0 refills | Status: DC
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
## 2018-01-27 NOTE — Discharge Instructions (Signed)
Please take the medications as prescribed.  Your vital signs and abdominal exam are reassuring.  I suspect the pain in your abdomen will abate over the next few days.
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
## 2018-01-28 ENCOUNTER — Telehealth (HOSPITAL_COMMUNITY): Payer: Self-pay | Admitting: *Deleted

## 2018-01-28 MED ORDER — CYCLOBENZAPRINE HCL 10 MG PO TABS
5.0000 mg | ORAL_TABLET | Freq: Three times a day (TID) | ORAL | 0 refills | Status: AC | PRN
Start: 2018-01-28 — End: ?

## 2018-01-28 MED ORDER — IBUPROFEN 800 MG PO TABS: 800.0000 mg | ORAL_TABLET | Freq: Three times a day (TID) | ORAL | 0 refills | Status: AC

## 2018-01-28 MED ORDER — CYCLOBENZAPRINE HCL 10 MG PO TABS: 5.0000 mg | ORAL_TABLET | Freq: Three times a day (TID) | ORAL | 0 refills | Status: DC | PRN

## 2018-01-28 MED ORDER — IBUPROFEN 800 MG PO TABS: 800.0000 mg | ORAL_TABLET | Freq: Three times a day (TID) | ORAL | 0 refills | Status: DC

## 2018-01-28 NOTE — Telephone Encounter (Signed)
Patient called stating pharmacy did not have her medications. [facility]
